# Patient Record
Sex: Male | Born: 1935 | Race: White | Hispanic: No | Marital: Married | Smoking: Never smoker
Health system: Southern US, Community
[De-identification: ages and names within clinical notes are randomized; demographics above are authoritative.]

## PROBLEM LIST (undated history)

## (undated) DIAGNOSIS — F329 Major depressive disorder, single episode, unspecified: Secondary | ICD-10-CM

## (undated) DIAGNOSIS — I509 Heart failure, unspecified: Secondary | ICD-10-CM

## (undated) DIAGNOSIS — N289 Disorder of kidney and ureter, unspecified: Secondary | ICD-10-CM

## (undated) DIAGNOSIS — M1990 Unspecified osteoarthritis, unspecified site: Secondary | ICD-10-CM

## (undated) DIAGNOSIS — I50810 Right heart failure, unspecified: Secondary | ICD-10-CM

## (undated) DIAGNOSIS — Z8489 Family history of other specified conditions: Secondary | ICD-10-CM

## (undated) DIAGNOSIS — R011 Cardiac murmur, unspecified: Secondary | ICD-10-CM

## (undated) DIAGNOSIS — F0390 Unspecified dementia without behavioral disturbance: Secondary | ICD-10-CM

## (undated) DIAGNOSIS — I219 Acute myocardial infarction, unspecified: Secondary | ICD-10-CM

## (undated) DIAGNOSIS — K219 Gastro-esophageal reflux disease without esophagitis: Secondary | ICD-10-CM

## (undated) DIAGNOSIS — E785 Hyperlipidemia, unspecified: Secondary | ICD-10-CM

## (undated) DIAGNOSIS — I82409 Acute embolism and thrombosis of unspecified deep veins of unspecified lower extremity: Secondary | ICD-10-CM

## (undated) DIAGNOSIS — G4730 Sleep apnea, unspecified: Secondary | ICD-10-CM

## (undated) DIAGNOSIS — N400 Enlarged prostate without lower urinary tract symptoms: Secondary | ICD-10-CM

## (undated) DIAGNOSIS — F32A Depression, unspecified: Secondary | ICD-10-CM

## (undated) DIAGNOSIS — I2510 Atherosclerotic heart disease of native coronary artery without angina pectoris: Secondary | ICD-10-CM

## (undated) DIAGNOSIS — Z951 Presence of aortocoronary bypass graft: Secondary | ICD-10-CM

## (undated) DIAGNOSIS — I10 Essential (primary) hypertension: Secondary | ICD-10-CM

## (undated) DIAGNOSIS — R600 Localized edema: Secondary | ICD-10-CM

## (undated) DIAGNOSIS — R0602 Shortness of breath: Secondary | ICD-10-CM

## (undated) HISTORY — DX: Hyperlipidemia, unspecified: E78.5

## (undated) HISTORY — DX: Sleep apnea, unspecified: G47.30

## (undated) HISTORY — PX: APPENDECTOMY: SHX54

## (undated) HISTORY — DX: Presence of aortocoronary bypass graft: Z95.1

## (undated) HISTORY — PX: CATARACT EXTRACTION, BILATERAL: SHX1313

## (undated) HISTORY — DX: Atherosclerotic heart disease of native coronary artery without angina pectoris: I25.10

## (undated) HISTORY — DX: Localized edema: R60.0

## (undated) HISTORY — DX: Right heart failure, unspecified: I50.810

## (undated) HISTORY — DX: Acute embolism and thrombosis of unspecified deep veins of unspecified lower extremity: I82.409

## (undated) HISTORY — PX: CARDIAC CATHETERIZATION: SHX172

## (undated) HISTORY — DX: Essential (primary) hypertension: I10

## (undated) HISTORY — PX: BACK SURGERY: SHX140

---

## 1983-08-01 DIAGNOSIS — Z951 Presence of aortocoronary bypass graft: Secondary | ICD-10-CM

## 1983-08-01 HISTORY — PX: CORONARY ARTERY BYPASS GRAFT: SHX141

## 1983-08-01 HISTORY — DX: Presence of aortocoronary bypass graft: Z95.1

## 1998-02-23 ENCOUNTER — Ambulatory Visit (HOSPITAL_COMMUNITY): Admission: RE | Admit: 1998-02-23 | Discharge: 1998-02-23 | Payer: Self-pay | Admitting: Cardiology

## 2001-06-10 ENCOUNTER — Encounter: Payer: Self-pay | Admitting: Cardiology

## 2001-06-10 ENCOUNTER — Ambulatory Visit (HOSPITAL_COMMUNITY): Admission: RE | Admit: 2001-06-10 | Discharge: 2001-06-10 | Payer: Self-pay | Admitting: Cardiology

## 2003-06-09 ENCOUNTER — Ambulatory Visit (HOSPITAL_COMMUNITY): Admission: RE | Admit: 2003-06-09 | Discharge: 2003-06-09 | Payer: Self-pay | Admitting: Gastroenterology

## 2003-07-08 ENCOUNTER — Inpatient Hospital Stay (HOSPITAL_COMMUNITY): Admission: RE | Admit: 2003-07-08 | Discharge: 2003-07-13 | Payer: Self-pay | Admitting: Orthopedic Surgery

## 2003-07-13 ENCOUNTER — Inpatient Hospital Stay
Admission: RE | Admit: 2003-07-13 | Discharge: 2003-07-27 | Payer: Self-pay | Admitting: Physical Medicine & Rehabilitation

## 2003-07-23 ENCOUNTER — Ambulatory Visit (HOSPITAL_COMMUNITY)
Admission: RE | Admit: 2003-07-23 | Discharge: 2003-07-23 | Payer: Self-pay | Admitting: Physical Medicine & Rehabilitation

## 2005-07-13 ENCOUNTER — Encounter: Payer: Self-pay | Admitting: Pulmonary Disease

## 2005-08-29 ENCOUNTER — Encounter: Payer: Self-pay | Admitting: Pulmonary Disease

## 2006-03-05 ENCOUNTER — Ambulatory Visit (HOSPITAL_COMMUNITY): Admission: RE | Admit: 2006-03-05 | Discharge: 2006-03-05 | Payer: Self-pay | Admitting: Cardiology

## 2007-12-31 ENCOUNTER — Ambulatory Visit: Payer: Self-pay | Admitting: Pulmonary Disease

## 2007-12-31 DIAGNOSIS — G4733 Obstructive sleep apnea (adult) (pediatric): Secondary | ICD-10-CM | POA: Insufficient documentation

## 2007-12-31 DIAGNOSIS — E785 Hyperlipidemia, unspecified: Secondary | ICD-10-CM | POA: Insufficient documentation

## 2007-12-31 DIAGNOSIS — I10 Essential (primary) hypertension: Secondary | ICD-10-CM | POA: Insufficient documentation

## 2008-03-29 ENCOUNTER — Ambulatory Visit: Payer: Self-pay | Admitting: *Deleted

## 2008-03-30 ENCOUNTER — Inpatient Hospital Stay (HOSPITAL_COMMUNITY): Admission: EM | Admit: 2008-03-30 | Discharge: 2008-04-02 | Payer: Self-pay | Admitting: Emergency Medicine

## 2008-03-30 ENCOUNTER — Encounter (INDEPENDENT_AMBULATORY_CARE_PROVIDER_SITE_OTHER): Payer: Self-pay | Admitting: *Deleted

## 2008-03-30 ENCOUNTER — Ambulatory Visit: Payer: Self-pay | Admitting: Surgery

## 2008-03-30 ENCOUNTER — Ambulatory Visit: Payer: Self-pay | Admitting: *Deleted

## 2008-11-04 ENCOUNTER — Encounter: Admission: RE | Admit: 2008-11-04 | Discharge: 2008-11-04 | Payer: Self-pay | Admitting: Cardiology

## 2009-03-24 ENCOUNTER — Inpatient Hospital Stay (HOSPITAL_COMMUNITY): Admission: EM | Admit: 2009-03-24 | Discharge: 2009-04-06 | Payer: Self-pay | Admitting: Emergency Medicine

## 2009-03-26 ENCOUNTER — Ambulatory Visit: Payer: Self-pay | Admitting: Physical Medicine & Rehabilitation

## 2009-03-30 ENCOUNTER — Encounter: Payer: Self-pay | Admitting: Internal Medicine

## 2009-04-06 ENCOUNTER — Inpatient Hospital Stay (HOSPITAL_COMMUNITY)
Admission: RE | Admit: 2009-04-06 | Discharge: 2009-04-17 | Payer: Self-pay | Admitting: Physical Medicine & Rehabilitation

## 2009-10-13 ENCOUNTER — Encounter: Admission: RE | Admit: 2009-10-13 | Discharge: 2009-10-13 | Payer: Self-pay | Admitting: Cardiology

## 2010-08-21 ENCOUNTER — Encounter: Payer: Self-pay | Admitting: Cardiology

## 2010-11-04 LAB — BASIC METABOLIC PANEL
BUN: 24 mg/dL — ABNORMAL HIGH (ref 6–23)
BUN: 28 mg/dL — ABNORMAL HIGH (ref 6–23)
BUN: 23 mg/dL (ref 6–23)
CO2: 22 mEq/L (ref 19–32)
CO2: 24 mEq/L (ref 19–32)
CO2: 22 mEq/L (ref 19–32)
Calcium: 8.5 mg/dL (ref 8.4–10.5)
Calcium: 8.8 mg/dL (ref 8.4–10.5)
Chloride: 102 mEq/L (ref 96–112)
Chloride: 107 mEq/L (ref 96–112)
Chloride: 107 mEq/L (ref 96–112)
Creatinine, Ser: 1.16 mg/dL (ref 0.4–1.5)
Creatinine, Ser: 1.33 mg/dL (ref 0.4–1.5)
GFR calc Af Amer: >60 mL/min (ref >60)
GFR calc Af Amer: >60 mL/min (ref >60)
GFR calc non Af Amer: 51 mL/min — ABNORMAL LOW (ref >60)
GFR calc non Af Amer: >60 mL/min (ref >60)
Glucose, Bld: 112 mg/dL — ABNORMAL HIGH (ref 70–99)
Glucose, Bld: 99 mg/dL (ref 70–99)
Glucose, Bld: 109 mg/dL — ABNORMAL HIGH (ref 70–99)
Potassium: 4.4 mEq/L (ref 3.5–5.1)
Potassium: 4.6 mEq/L (ref 3.5–5.1)
Potassium: 4.2 mEq/L (ref 3.5–5.1)
Sodium: 131 mEq/L — ABNORMAL LOW (ref 135–145)
Sodium: 135 mEq/L (ref 135–145)
Sodium: 138 mEq/L (ref 135–145)

## 2010-11-04 LAB — CBC
HCT: 37.9 % — ABNORMAL LOW (ref 39.0–52.0)
HCT: 34.4 % — ABNORMAL LOW (ref 39.0–52.0)
HCT: 35.8 % — ABNORMAL LOW (ref 39.0–52.0)
HCT: 36.8 % — ABNORMAL LOW (ref 39.0–52.0)
Hemoglobin: 13 g/dL (ref 13.0–17.0)
Hemoglobin: 12.3 g/dL — ABNORMAL LOW (ref 13.0–17.0)
Hemoglobin: 12.7 g/dL — ABNORMAL LOW (ref 13.0–17.0)
MCHC: 34.2 g/dL (ref 30.0–36.0)
MCHC: 33.7 g/dL (ref 30.0–36.0)
MCHC: 33.7 g/dL (ref 30.0–36.0)
MCHC: 34.4 g/dL (ref 30.0–36.0)
MCV: 94.7 fL (ref 78.0–100.0)
MCV: 94.2 fL (ref 78.0–100.0)
MCV: 94.4 fL (ref 78.0–100.0)
MCV: 94.8 fL (ref 78.0–100.0)
MCV: 95.3 fL (ref 78.0–100.0)
Platelets: 248 10*3/uL (ref 150–400)
Platelets: 220 10*3/uL (ref 150–400)
Platelets: 225 10*3/uL (ref 150–400)
RBC: 4 MIL/uL — ABNORMAL LOW (ref 4.22–5.81)
RBC: 3.56 MIL/uL — ABNORMAL LOW (ref 4.22–5.81)
RBC: 3.63 MIL/uL — ABNORMAL LOW (ref 4.22–5.81)
RBC: 3.89 MIL/uL — ABNORMAL LOW (ref 4.22–5.81)
RDW: 13.3 % (ref 11.5–15.5)
RDW: 13.2 % (ref 11.5–15.5)
WBC: 8.2 10*3/uL (ref 4.0–10.5)
WBC: 8.1 10*3/uL (ref 4.0–10.5)
WBC: 8.2 10*3/uL (ref 4.0–10.5)
WBC: 8.5 10*3/uL (ref 4.0–10.5)

## 2010-11-04 LAB — COMPREHENSIVE METABOLIC PANEL
ALT: 21 U/L (ref 0–53)
AST: 39 U/L — ABNORMAL HIGH (ref 0–37)
Albumin: 2.8 g/dL — ABNORMAL LOW (ref 3.5–5.2)
Alkaline Phosphatase: 84 U/L (ref 39–117)
BUN: 30 mg/dL — ABNORMAL HIGH (ref 6–23)
CO2: 23 mEq/L (ref 19–32)
Calcium: 8.9 mg/dL (ref 8.4–10.5)
Chloride: 104 mEq/L (ref 96–112)
Creatinine, Ser: 1.35 mg/dL (ref 0.4–1.5)
GFR calc Af Amer: >60 mL/min (ref >60)
GFR calc non Af Amer: 50 mL/min — ABNORMAL LOW (ref >60)
Glucose, Bld: 108 mg/dL — ABNORMAL HIGH (ref 70–99)
Potassium: 5.1 mEq/L (ref 3.5–5.1)
Sodium: 133 mEq/L — ABNORMAL LOW (ref 135–145)
Total Bilirubin: 1.7 mg/dL — ABNORMAL HIGH (ref 0.3–1.2)
Total Protein: 5.7 g/dL — ABNORMAL LOW (ref 6.0–8.3)

## 2010-11-04 LAB — PROTIME-INR
INR: 2.4 — ABNORMAL HIGH (ref 0.00–1.49)
INR: 2.1 — ABNORMAL HIGH (ref 0.00–1.49)
INR: 2.1 — ABNORMAL HIGH (ref 0.00–1.49)
INR: 2.3 — ABNORMAL HIGH (ref 0.00–1.49)
INR: 2.3 — ABNORMAL HIGH (ref 0.00–1.49)
INR: 1.3 (ref 0.00–1.49)
INR: 2.3 — ABNORMAL HIGH (ref 0.00–1.49)
Prothrombin Time: 25.9 seconds — ABNORMAL HIGH (ref 11.6–15.2)
Prothrombin Time: 23 seconds — ABNORMAL HIGH (ref 11.6–15.2)
Prothrombin Time: 23.3 seconds — ABNORMAL HIGH (ref 11.6–15.2)
Prothrombin Time: 25.4 seconds — ABNORMAL HIGH (ref 11.6–15.2)
Prothrombin Time: 25.4 seconds — ABNORMAL HIGH (ref 11.6–15.2)
Prothrombin Time: 27.9 seconds — ABNORMAL HIGH (ref 11.6–15.2)
Prothrombin Time: 14.6 seconds (ref 11.6–15.2)
Prothrombin Time: 20.6 seconds — ABNORMAL HIGH (ref 11.6–15.2)

## 2010-11-04 LAB — URINALYSIS, MICROSCOPIC ONLY
Bilirubin Urine: NEGATIVE
Glucose, UA: NEGATIVE mg/dL
Hgb urine dipstick: NEGATIVE
Ketones, ur: NEGATIVE mg/dL
Leukocytes, UA: NEGATIVE
Nitrite: NEGATIVE
Protein, ur: NEGATIVE mg/dL
Specific Gravity, Urine: 1.008 (ref 1.005–1.030)
Urobilinogen, UA: 0.2 mg/dL (ref 0.0–1.0)
pH: 5 (ref 5.0–8.0)

## 2010-11-04 LAB — DIFFERENTIAL
Basophils Absolute: 0 10*3/uL (ref 0.0–0.1)
Basophils Relative: 1 % (ref 0–1)
Eosinophils Absolute: 0.2 10*3/uL (ref 0.0–0.7)
Eosinophils Relative: 3 % (ref 0–5)
Lymphocytes Relative: 31 % (ref 12–46)
Lymphs Abs: 2.5 10*3/uL (ref 0.7–4.0)
Monocytes Absolute: 0.7 10*3/uL (ref 0.1–1.0)
Monocytes Relative: 9 % (ref 3–12)
Neutro Abs: 4.6 10*3/uL (ref 1.7–7.7)
Neutrophils Relative %: 57 % (ref 43–77)

## 2010-11-04 LAB — URINE CULTURE: Colony Count: 60000

## 2010-11-04 LAB — HEPARIN LEVEL (UNFRACTIONATED): Heparin Unfractionated: 0.22 IU/mL — ABNORMAL LOW (ref 0.30–0.70)

## 2010-11-05 LAB — DIFFERENTIAL
Lymphocytes Relative: 14 % (ref 12–46)
Lymphs Abs: 1.9 10*3/uL (ref 0.7–4.0)
Neutrophils Relative %: 76 % (ref 43–77)

## 2010-11-05 LAB — PROTIME-INR
INR: 2.1 — ABNORMAL HIGH (ref 0.00–1.49)
INR: 2.5 — ABNORMAL HIGH (ref 0.00–1.49)
INR: 3 — ABNORMAL HIGH (ref 0.00–1.49)
Prothrombin Time: 18.4 seconds — ABNORMAL HIGH (ref 11.6–15.2)
Prothrombin Time: 23.1 seconds — ABNORMAL HIGH (ref 11.6–15.2)
Prothrombin Time: 27.1 seconds — ABNORMAL HIGH (ref 11.6–15.2)
Prothrombin Time: 27.8 seconds — ABNORMAL HIGH (ref 11.6–15.2)
Prothrombin Time: 30.2 seconds — ABNORMAL HIGH (ref 11.6–15.2)

## 2010-11-05 LAB — BASIC METABOLIC PANEL
BUN: 20 mg/dL (ref 6–23)
BUN: 37 mg/dL — ABNORMAL HIGH (ref 6–23)
BUN: 39 mg/dL — ABNORMAL HIGH (ref 6–23)
CO2: 24 mEq/L (ref 19–32)
CO2: 26 mEq/L (ref 19–32)
Calcium: 8.6 mg/dL (ref 8.4–10.5)
Chloride: 107 mEq/L (ref 96–112)
Chloride: 108 mEq/L (ref 96–112)
Chloride: 109 mEq/L (ref 96–112)
Creatinine, Ser: 1.25 mg/dL (ref 0.4–1.5)
Creatinine, Ser: 1.58 mg/dL — ABNORMAL HIGH (ref 0.4–1.5)
Creatinine, Ser: 1.69 mg/dL — ABNORMAL HIGH (ref 0.4–1.5)
GFR calc Af Amer: 47 mL/min — ABNORMAL LOW (ref >60)
GFR calc Af Amer: >60 mL/min (ref >60)
GFR calc Af Amer: >60 mL/min (ref >60)
GFR calc non Af Amer: 39 mL/min — ABNORMAL LOW (ref >60)
GFR calc non Af Amer: >60 mL/min (ref >60)
GFR calc non Af Amer: >60 mL/min (ref >60)
Glucose, Bld: 111 mg/dL — ABNORMAL HIGH (ref 70–99)
Glucose, Bld: 124 mg/dL — ABNORMAL HIGH (ref 70–99)
Potassium: 4.3 mEq/L (ref 3.5–5.1)
Potassium: 4.4 mEq/L (ref 3.5–5.1)
Potassium: 4.7 mEq/L (ref 3.5–5.1)
Potassium: 4.7 mEq/L (ref 3.5–5.1)
Potassium: 4.8 mEq/L (ref 3.5–5.1)
Sodium: 136 mEq/L (ref 135–145)
Sodium: 138 mEq/L (ref 135–145)

## 2010-11-05 LAB — CBC
HCT: 34.5 % — ABNORMAL LOW (ref 39.0–52.0)
HCT: 35 % — ABNORMAL LOW (ref 39.0–52.0)
HCT: 36.3 % — ABNORMAL LOW (ref 39.0–52.0)
Hemoglobin: 12.1 g/dL — ABNORMAL LOW (ref 13.0–17.0)
MCHC: 33.7 g/dL (ref 30.0–36.0)
MCHC: 34 g/dL (ref 30.0–36.0)
MCV: 93.8 fL (ref 78.0–100.0)
MCV: 95 fL (ref 78.0–100.0)
Platelets: 239 10*3/uL (ref 150–400)
Platelets: 249 10*3/uL (ref 150–400)
Platelets: 303 10*3/uL (ref 150–400)
RBC: 3.74 MIL/uL — ABNORMAL LOW (ref 4.22–5.81)
RBC: 3.83 MIL/uL — ABNORMAL LOW (ref 4.22–5.81)
WBC: 12 10*3/uL — ABNORMAL HIGH (ref 4.0–10.5)
WBC: 13.5 10*3/uL — ABNORMAL HIGH (ref 4.0–10.5)
WBC: 7.7 10*3/uL (ref 4.0–10.5)

## 2010-11-05 LAB — TSH: TSH: 1.48 u[IU]/mL (ref 0.350–4.500)

## 2010-11-05 LAB — URINALYSIS, ROUTINE W REFLEX MICROSCOPIC
Ketones, ur: NEGATIVE mg/dL
Nitrite: NEGATIVE
Protein, ur: NEGATIVE mg/dL
pH: 5 (ref 5.0–8.0)

## 2010-11-05 LAB — CARDIAC PANEL(CRET KIN+CKTOT+MB+TROPI)
CK, MB: 4 ng/mL (ref 0.3–4.0)
Relative Index: INVALID (ref 0.0–2.5)
Total CK: 215 U/L (ref 7–232)
Troponin I: 0.02 ng/mL (ref 0.00–0.06)
Troponin I: 0.03 ng/mL (ref 0.00–0.06)

## 2010-11-05 LAB — HEPARIN LEVEL (UNFRACTIONATED): Heparin Unfractionated: 0.73 IU/mL — ABNORMAL HIGH (ref 0.30–0.70)

## 2010-11-05 LAB — HEPATIC FUNCTION PANEL
ALT: 21 U/L (ref 0–53)
Alkaline Phosphatase: 95 U/L (ref 39–117)
Total Bilirubin: 0.9 mg/dL (ref 0.3–1.2)

## 2010-11-05 LAB — POCT CARDIAC MARKERS
CKMB, poc: 2.4 ng/mL (ref 1.0–8.0)
Myoglobin, poc: 225 ng/mL (ref 12–200)
Troponin i, poc: <0.05 ng/mL (ref 0.00–0.09)

## 2010-11-05 LAB — APTT: aPTT: 98 seconds — ABNORMAL HIGH (ref 24–37)

## 2010-12-13 NOTE — Discharge Summary (Signed)
NAME:  Dale Whitehead, Dale Whitehead NO.:  [id number]   MEDICAL RECORD NO.:  [id number]          PATIENT TYPE:  INP   LOCATION:  5154                         FACILITY:  [facility]   PHYSICIAN:  Kela Millin, M.D.DATE OF BIRTH:  1924-02-07   DATE OF ADMISSION:  03/30/2008  DATE OF DISCHARGE:  04/02/2008                               DISCHARGE SUMMARY   DISCHARGE DIAGNOSES:  1. Bilateral deep vein thrombosis.  2. Hypertension.  3. History of coronary artery disease and status post coronary artery      bypass graft.  4. History of obstructive sleep apnea on continuous positive airway      pressure.  5. History of hyperlipidemia.  6. History of benign prostatic hypertrophy.  7. History of chronic low back pain.   PROCEDURES/STUDIES:  1. Lower extremity Doppler ultrasound - DVT present in the common      femoral and profunda veins bilaterally with superficial thrombosis      in the proximal left greater saphenous vein.  2. CT angiogram of chest - no evidence of pulmonary embolism, no acute      pulmonary parenchymal abnormality.   BRIEF HISTORY:  The patient is an 77 year old white male with the above  listed medical problems as well as history of a DVT about 8 years ago,  who presented with complaints of left leg swelling that had been  persistent and progressed all the way up to his thigh and even in the  perineal area.  He reported that he has had a DVT in the right lower  extremity about 8 years ago.  The patient denied chest pain,  palpitations, dyspnea and no pleuritic pain.   Please see the full admission history and physical dictated by Dr. Flonnie Overman  for the details of the admission physical exam as well as the laboratory  data.   HOSPITAL COURSE:  1. Bilateral deep vein thrombosis - second episode, as discussed      above.  Upon admission, the patient was started on anticoagulation      with heparin, Lovenox and Coumadin.  His PT/INR was monitored and      today it  is day 4 and his INR is 2.3.  He has not had any evidence      of bleeding.  His left lower extremity, the swelling is markedly      improved and he is hemodynamically stable.  He will be discharged      on Lovenox for 1 more day.  If his INR is still therapeutic on      recheck in the morning, the Lovenox will need to be discontinued at      that time.  The home health RN is to check his PT/INR in a.m. and      call it to his primary care physician, Dr. Catha Gosselin for further      management and dosing of the Coumadin.  As already noted, this was      the patient's second episode of DVT.  He will need to be on      Coumadin long-term  and follow up with him for further      recommendations.  His aspirin was discontinued in the hospital.  2. History of hypertension - the patient was maintained on his      preadmission medications during his hospital stay and is to      continue this upon discharge.  3. History of obstructive sleep apnea  - he is to continue CPAP upon      discharge.  4. History of coronary artery disease - the patient remained chest      pain free during his hospital stay.  He is to follow up with      cardiology upon discharge.  He was maintained on his Zetia in the      hospital.  5. History of chronic low back pain - the patient was maintained on      his Lidoderm patch in the hospital.  6. History of BPH - he is to continue Flomax upon discharge.   DISCHARGE MEDICATIONS:  1. Lovenox 75 mg subcutaneous q.12 h., to be discontinued if INR      remains therapeutic upon recheck in the morning.  2. Coumadin 5 mg 1 tablet p.o. q.p.m. or as directed per PCP.  3. The patient to discontinue aspirin.  4. The patient to continue Pepcid 20 mg b.i.d.  5. Tramadol 50 mg p.o. daily.  6. Zetia 10 mg p.o. daily.  7. Torsemide 20 mg p.o. daily.  8. Verapamil 120 mg p.o. daily.  9. Flomax 0.4 mg p.o. daily.  10.K-Dur 20 mg p.o. daily.  11.Lidoderm patch on 12 hours and off 12  hours as previously.   FOLLOW-UP CARE:  1. Dr. Catha Gosselin next week.  Patient to call for appointment.  2. Home health RN for PT/INR in a.m.  3. Cardiology, patient to call for appointment upon discharge.   CONDITION ON DISCHARGE:  Improved, stable.      Kela Millin, M.D.  Electronically Signed     ACV/MEDQ  D:  04/02/2008  T:  04/02/2008  Job:  [id number]   cc:   Caryn Bee L. Little, M.D.

## 2010-12-13 NOTE — H&P (Signed)
NAME:  Dale Whitehead, Dale Whitehead NO.:  [id number]   MEDICAL RECORD NO.:  [id number]          PATIENT TYPE:  INP   LOCATION:                               FACILITY:  [facility]   PHYSICIAN:  Lucita Ferrara, MD         DATE OF BIRTH:  07/30/1924   DATE OF ADMISSION:  03/30/2008  DATE OF DISCHARGE:                              HISTORY & PHYSICAL   The patient is an 77 year old Caucasian male with a past medical history  significant for coronary artery disease status post CABG, hypertension,  hypercholesteremia, obstructive sleep apnea, and a history of deep vein  thrombosis eight years ago, who presents with acute swelling of his left  lower extremity that is persistent and the swelling progresses all the  way up his entire lower extremity, his thigh, and even in the perineal  area.  He had a similar episode eight years ago that was located in his  right lower extremity and his symptoms are much similar to that episode.  He denies any chest pain, palpitations, dyspnea, or pleuritis.   PAST MEDICAL HISTORY:  1. History of coronary artery disease.  2. Hypertension.  3. Hyperlipidemia.  4. Obstructive sleep apnea, on CPAP.  5. DVT.   PAST SURGICAL HISTORY:  Status post CABG in 1999.   SOCIAL HISTORY:  He denies drugs, alcohol, or tobacco.   MEDICATIONS AT HOME:  1. Verapamil 120 mg daily.  2. Flomax 0.4 mg daily.  3. Pepcid 10 mg twice a day.  4. Ecotrin 81 mg daily.  5. Tramadol 50 mg p.o. daily.  6. Zetia 10 mg daily.  7. Furosemide 20 mg p.o. daily.  8. Potassium chloride 20 mEq p.o. daily.   REVIEW OF SYSTEMS:  As per HPI, otherwise negative.   EKG shows sinus tachycardia with chronic right lower branch block,  unchanged from July 16, 2003.  CT scan of the chest showed no  pulmonary embolism.   PHYSICAL EXAMINATION:  GENERAL:  The patient is in no acute distress.  VITAL SIGNS:  Blood pressure is 152/84, pulse 105, respirations 18,  temperature is 98.2.  The  patient's pulse ox is 95% on room air.  HEENT:  Normocephalic, atraumatic.  Sclerae are anicteric.  Neck is  supple.  No JVD or carotid bruits.  PERRLA.  Extraocular muscles intact.  CARDIOVASCULAR:  S1 and S2, regular rate and rhythm.  No murmurs, rubs,  or clicks.  LUNGS:  Clear to auscultation bilaterally.  No rhonchi, rales, or  wheezes.  NEURO:  The patient is alert and oriented times three.  Cranial nerves  II through XII are grossly intact.   LABORATORY DATA:  Urinalysis essentially negative.  D-dimer is greater  than 20.  Complete metabolic panel shows sodium 132, albumin of 3.2, BUN  of 29, creatinine 1.17.  Beta natriuretic peptide 46.  INR is 1.1.  Cardiac enzymes negative.  The patient had a CT angio, which was not  negative, with no evidence of pulmonary embolism.  Chest x-ray:  Mild  cardiac enlargement, without any acute pulmonary process.   ASSESSMENT  AND PLAN:  An 76 year old with:  1. Acute left lower extremity swelling, very impressive and      significant, likely deep vein thrombosis with D-dimer greater than      20.  CT angiogram, however, is negative for pulmonary embolism.  2. Coronary artery disease, status post coronary artery bypass graft.      Continue aspirin.  Continue Zetia.  3. Obstructive sleep apnea on continuous positive airway pressure at      home.  Continue CPAP with same settings.  4. Hypertension, controlled with verapamil.  5. Hyperlipidemia, on Zetia.  6. Benign prostatic hypertrophy, on Flomax.   PLAN:  Will initiate heparin protocol, therapeutic dose.  Will get  bilateral lower extremity Dopplers, rule out DVT in the right lower  extremity.  DVT and GI prophylaxis.  The rest of the plans are dependent  upon his progress.      Lucita Ferrara, MD  Electronically Signed     RR/MEDQ  D:  03/30/2008  T:  03/30/2008  Job:  [id number]

## 2010-12-13 NOTE — H&P (Signed)
NAME:  Dale Whitehead, Dale Whitehead NO.:  [id number]   MEDICAL RECORD NO.:  [id number]          PATIENT TYPE:  INP   LOCATION:  0102                         FACILITY:  [facility]   PHYSICIAN:  Peggye Pitt, M.D. DATE OF BIRTH:  June 06, 1924   DATE OF ADMISSION:  03/24/2009  DATE OF DISCHARGE:                              HISTORY & PHYSICAL   CHIEF COMPLAINT:  Fall and weakness.   HISTORY OF PRESENT ILLNESS:  Dale Whitehead is an 77 year old male with a  history of CAD, HTN, hyperlipidemia, and chronic low back pain.  He  states that this morning after voiding he walked out of the bathroom and  his legs gave out.  He also states that his legs felt like rubber and  more significantly, once he was on the ground, he was unable to get off  of the floor even with the assistance of his family.  It was at this  point that they called EMS for transport to the emergency department.  He denies loss of conscientiousness, dizziness, he does acknowledge some  mild nausea, no vomiting.  Of note, he had an epidural steroid injection  on August 13 and is under the care of Dr. Modesta Messing for chronic low back  pain after a lifting injury that occurred in January of this year.  We  are asked to admit for further evaluation and treatment.   ALLERGIES:  AMBIEN.   PAST MEDICAL HISTORY:  1. Coronary artery disease.  2. Hypertension.  3. Hyperlipidemia.  4. Sleep apnea.  5. Deep vein thrombosis.  6. Benign prostatic hyperplasia.  7. Chronic low back pain.   PAST SURGICAL HISTORY:  1. Coronary artery bypass graft in 1999.  2. Right cataract surgery, 2009.  3. A series of epidural steroid injections to lumbar spine.  4. Left shoulder replacement, 2005.  5. Appendectomy approximately 40 years ago.   SOCIAL HISTORY:  Negative for tobacco, alcohol, or illicit drugs.   FAMILY HISTORY:  Noncontributory for this admission.   MEDICATIONS:  1. Lisinopril 20 mg 1/2 tablet daily.  2. Coumadin 3 mg 4 nights a  week.  3. Coumadin 4 mg 3 nights a week.  4. MiraLax 17 gm q.h.s.  5. Colace 1 tablet q.h.s.  6. Potassium chloride 20 mEq p.o. daily.  7. Tamsulosin 0.4 mg p.o. daily.  8. Torsemide 20 mg p.o. daily.   REVIEW OF SYSTEMS:  GENERAL:  Negative for fever, negative for chills.  Positive for decreased appetite, positive for generalized weakness  increasing over the last several months.  EAR, NOSE, AND THROAT:  Negative for sore throat, negative for nasal congestion.  CARDIOVASCULAR:  Negative for chest pain.  Negative for palpitations,  negative for lower extremity edema.  RESPIRATIONS:  Negative for  shortness of breath, negative cough.  MUSCULOSKELETAL:  Positive for low  back pain, positive for the left shoulder achiness on occasion;  otherwise, no joint pain.  Positive for muscle weakness.  NEUROLOGICAL:  Negative headache, negative visual disturbances.  Positive for decreased  peripheral vision of the left eye.  GASTROINTESTINAL:  Positive  constipation.  Negative diarrhea,  negative abdominal pain.  GENITOURINARY:  Negative for dysuria, negative hematuria, negative  frequency.  PSYCHIATRIC:  Positive for anxiety, positive for depression.  SKIN:  Negative rashes, negative for lesions.   LABORATORY DATA/RADIOLOGY:  His sodium is 131, potassium 4.8, chloride  101, CO2 22, BUN 39, creatinine 1.69.  White count 13.5, hemoglobin  14.1, hematocrit 41.0, platelets 303, glucose 123.  Urinalysis was  negative.  Myoglobin 225, CK-MB 2.4, troponin less than 0.05.  CT of the  chest showed post CABG, no CHF.  Lungs clear.  L-spine x-ray:  L4-5  compression fracture of indeterminate age.  Osteopenia.   PHYSICAL EXAMINATION:  Temperature 98.5, blood pressure 135/79, heart  rate 100, respirations 18, 95% on room air.  GENERAL:  Pleasant, alert, oriented, sitting up in bed in no acute  distress.  HEAD:  Normocephalic, atraumatic.  CARDIOVASCULAR:  Regular rate and rhythm, no gallop, no murmur.   Trace  lower extremity edema.  RESPIRATIONS:  Clear to auscultation bilaterally.  No wheezes, no rales,  no increased work of breathing.  MUSCULOSKELETAL:  Joints without swelling or redness, movement of  extremities.  NEUROLOGICAL:  Alert and oriented.  Speech clear, facial symmetry,  sensation, and extremities intact bilaterally.  Bilateral grip 3/5  bilaterally.  GENITOURINARY:  Abdomen round, slightly firm, nontender with decreased  bowel sounds.  PSYCHIATRIC:  He is calm, appropriate, cooperative.  SKIN:  Negative for lesions or rashes.   ASSESSMENT AND PLAN:  1. Fall, weakness, probably secondary to dehydration but need to      consider his chronic low back pain and the  L4-5 compression fracture of indeterminate age.  We will get a PT/OT  consult, consider some Home Health versus placement needs.  1. Acute renal insufficiency, probably secondary to dehydration.  We      will admit to telemetry given history of coronary artery disease.      We will hydrate with IV fluids.  We will hold ACE and diuretic,      check orthostatics, get a BMET in the morning.  If no improvement,      we will consider ultrasound to rule out renal obstruction.  We will      consider discontinuing telemetry after first 24 hours, if no      arrhythmias.  2. Hyperlipidemia.  He was on statin probably 5 months ago, which they      took him off due to previous side effects that were quite severe in      terms of edema with muscle pain and weakness.  We will check liver      function tests.  3. Hypertension.  Blood pressure well-controlled.  We will monitor and      introduce medications as needed.  4. History of deep venous thrombosis.  We will check a PT/INR, ask      pharmacy to monitor and dose the Coumadin.  If INR is      subtherapeutic, we may need to bridge with full dose Lovenox.  5. History of coronary artery disease.  Stable with no chest pain, no      shortness of breath.  Nevertheless, we  will cycle cardiac enzymes      and place on telemetry.  Again, we will consider discontinuing the      telemetry if no arrhythmias after the first 24 hours.  6. Sleep apnea.  He is on nightly CPAP.  We will ask respiratory      therapy to titrate  CPAP.  7. Leukocytosis.  His white count is 13.5.  He is currently afebrile.      Chest x-ray shows lungs are clear.  Urinalysis is negative.  No      need for antibiotics at this time.  We will continue to monitor.     ______________________________  Toya Smothers, NP      Peggye Pitt, M.D.  Electronically Signed    KB/MEDQ  D:  03/24/2009  T:  03/24/2009  Job:  [id number]

## 2010-12-13 NOTE — Group Therapy Note (Signed)
NAME:  Dale Whitehead, Dale Whitehead NO.:  [id number]   MEDICAL RECORD NO.:  [id number]          PATIENT TYPE:  INP   LOCATION:  1414                         [facility]ACILITY:  [facility]   PHYSICIAN:  Hollice Espy, M.D.DATE OF BIRTH:  1923/08/31                                 PROGRESS NOTE   This summary will cover events from August 25 to March 30, 2009.   PRIMARY CARE PHYSICIAN:  Caryn Bee L. Little, M.D.   CONSULTANTS ON THE CASE:  Hospice and palliative care medicine and Dr. Ruel Favors in  interventional radiology and inpatient rehab.   HOSPITAL COURSE:  The patient is an 77 year old white male with past medical history of  CAD, hypertension and DVT on chronic Coumadin who presented to the  emergency room with difficulty walking, his legs are very weak and also  some significant lower back pain.  The patient had an epidural steroid  injection.  Has had a history of chronic low back pain but he felt that  his pain has been even worse.  He was brought in to the emergency room  for further evaluation and treatment.  Initially on presentation he was  found to have a white count of 13.5, an elevated BUN and creatinine with  a BUN of 39 and creatinine of 1.6.  Labs were sent.  He showed no signs  of any urinary tract infection or pneumonia and following IV hydration,  he significantly improved.  His back pain, however, did not.  An MRI was  ordered and the patient was found to have signs of previous compression  fracture which was known but he also was found to have a new acute L4  compression fracture.  In the mean time, in discussion with the family,  they were concerned about the possibility of caring for the patient at  home, although the patient himself wanted pain control as well as the  ability to go home.  He did not look for any type of long-term  placement.  Palliative care was consulted both for pain management as  well as for goals of care.  In addition, in the meantime,  the patient  was evaluated by interventional radiology who found him appropriate for  a kyphoplasty.  The patient's Coumadin was held.  He was transitioned  over to IV heparin once his Coumadin level fell below 2.  It was down  below 1.5 on March 30, 2009 and the patient was taken over to [facility] Interventional Radiology and kyphoplasty was performed at the L4  level which no complications were noted.  The patient postoperatively  was feeling better.  He has been seen immediately postoperative and is  still not by guidelines able to move much but he says he does feel as if  his back pain is somewhat better.  In the meantime, he has been  evaluated prior to his kyphoplasty by inpatient rehab who find him  appropriate for inpatient rehab and they state that they will follow up  with him post kyphoplasty to see if he is still able to tolerate rehab.  Palliative  care plans to follow up with the patient on March 31, 2009  after his kyphoplasty for goals of care and, again, any pain control  which is needed.   His other medical issues have been relatively stable.  Following IV  fluids, his renal failure has come down to its baseline.  He had some  blood pressure issues, mostly related somewhat to pain.  His fluids have  been titrated down and his blood pressure medications have been  restarted.  He otherwise looks to be doing well.      Hollice Espy, M.D.  Electronically Signed     SKK/MEDQ  D:  03/30/2009  T:  03/30/2009  Job:  [id number]

## 2010-12-16 NOTE — Discharge Summary (Signed)
NAME:  Dale Whitehead, Dale Whitehead                      ACCOUNT NO.:  [id number]   MEDICAL RECORD NO.:  [id number]                   PATIENT TYPE:  INP   LOCATION:  5020                                 FACILITY:  [facility]   PHYSICIAN:  Harvie Junior, M.D.                DATE OF BIRTH:  1924/01/07   DATE OF ADMISSION:  07/08/2003  DATE OF DISCHARGE:  07/13/2003                                 DISCHARGE SUMMARY   ADMISSION DIAGNOSES:  1. Comminuted four part head splitting left proximal humerus fracture.  2. Coronary artery disease.  3. Hyperlipidemia.  4. Hypertension.   DISCHARGE DIAGNOSES:  1. Comminuted four part head splitting left proximal humerus fracture.  2. Coronary artery disease.  3. Hyperlipidemia.  4. Hypertension.  5. Left knee contusion.   PROCEDURE IN HOSPITAL:  Hemiarthroplasty of the left shoulder, Harvie Junior, M.D., July 08, 2003.   CONSULTATION IN HOSPITAL:  Cardiology, Gaspar Garbe B. Little, M.D.   BRIEF HISTORY AND PHYSICAL:  Dale Whitehead is a pleasant, 77 year old white male  who had a history of a fall at work on July 02, 2003 when he slipped on  some oil on the floor landing on his left shoulder area.  He went to the  emergency room where x-rays showed a comminuted head splitting left proximal  humerus fracture.  This was confirmed by CT scan.  He was seen in the office  and felt to be a candidate for a left shoulder hemiarthroplasty because of  the significant fracture in his left upper extremity.  He was admitted for  this.   PERTINENT LABORATORY AND X-RAY DATA:  EKG on admission showed normal sinus  rhythm with occasional premature supraventricular complexes with a right  bundle branch block.  X-ray of the left knee showed no definite acute bony  abnormality.  Portable x-ray of the left shoulder postoperatively showed  status post left shoulder replacement.  Hemoglobin on admission was 13.2,  hematocrit 39.5.  Indices were within normal limits.  On  postoperative day  one his hemoglobin was 11.3 with a hematocrit of 33.4.  CMET on admission  was within normal limits, other than a slightly elevated glucose at 107 and  decreased albumin at 3.2.  Urinalysis showed 3:6 WBC's per high powered  field, not felt to be clinically significant.   HOSPITAL COURSE:  The patient underwent surgery as was described in Dr.  Luiz Blare' operative note on July 08, 2003.  Postoperatively he was put on  Ancef 1 gram q. 8 hours times four doses.  He was given IV morphine for pain  control and his arm was in a sling.  Physical therapy and occupational  therapy were ordered for activities of daily living and forward flexion of  the left shoulder with his hand to the top of his head.  Cardiology consult  was obtained from Dr. Clarene Duke and he was status post coronary artery bypass  grafting.  He had some increased blood pressure postoperatively, but  currently was felt to be pain related.  He was felt to be medically stable  at this point.  He had no significant chest pain or shortness of breath  postoperatively and the examination was benign.  Cardiology signed off at  this time.  On postoperative day two, the patient was feeling better, but  not able to be up independently.  Physical therapy felt that he was a  candidate for inpatient rehabilitation for subacute bed.  Postoperative x-  rays of the left shoulder showed good position of the prosthesis.  He did  have some hiccups and had slow progress with physical therapy.  On  postoperative day five he had moderate shoulder pain, he was taking fluids  without difficulty and he was voiding without difficulty.  His vital signs  were stable and he was afebrile.  The left shoulder wound was benign.  The  patient continued to be somewhat groggy through the hospital and was slow to  move in the bed and in need of significant assistance.  He did have some  moderate abdominal distension.  The patient was then discharged  and  transferred to the subacute floor.   CONDITION ON DISCHARGE:  Improved.   ACTIVITY STATUS:  Wear a sling on the left side.  He can work on passive  forward flexion to 90 degrees with the hand to the top of the head with  physical therapy and occupational therapy.   MEDICATIONS AT DISCHARGE:  1. Percocet p.r.n. for pain.  2. His usual home medications.   We will see him while in the subacute unit every few days from an  orthopaedic viewpoint.  Certainly we will be available should any problems  crop up.      Marshia Ly, P.A.                       Harvie Junior, M.D.    Cordelia Pen  D:  09/02/2003  T:  09/03/2003  Job:  [id number]   cc:   Thereasa Solo. Little, M.D.  [address]  [address]  [place]  [place] [address]  Fax: [phone]   Al Decant. Janey Greaser, MD  [address]  [place]  [place] [address]  Fax: [phone]

## 2010-12-16 NOTE — Op Note (Signed)
NAME:  AUDLEY, HINOJOS                      ACCOUNT NO.:  [id number]   MEDICAL RECORD NO.:  [id number]                   PATIENT TYPE:  AMB   LOCATION:  ENDO                                 FACILITY:  [facility]   PHYSICIAN:  James L. Malon Kindle., M.D.          DATE OF BIRTH:  11/22/1923   DATE OF PROCEDURE:  06/09/2003  DATE OF DISCHARGE:                                 OPERATIVE REPORT   PROCEDURE:  Colonoscopy.   MEDICATIONS:  Ampicillin 2 g IV per Dr. Fredirick Maudlin office due to the patient's  previous history of bypass surgery (apparently this is felt to be needed),  fentanyl 25 mcg IV, Versed 5 mg IV.   SCOPE:  Olympus adjustable colonoscope.   INDICATIONS:  Colon cancer screening.   DESCRIPTION OF PROCEDURE:  The procedure had been explained to the patient  and consent obtained.  With the patient in the left lateral decubitus  position, the Olympus adjustable colonoscope was inserted and advanced.  The  prep was extremely poor.  The patient had a very long, tortuous colon with  dilation, with no frank volvulus.  We were able to pass through the sigmoid.  A lot of irrigation and suction was required. There was sticky, adherent  stool throughout.  Small polyps could have been missed.  The diameter of the  colon was quite dilated, really from the sigmoid on up, although there was  no gross obstruction.  Using position changes and abdominal pressure, we  eventually were able to advance down into the cecum.  There was a transient  period of time when the scope was plugged up with pieces of stool that we  were unable to suction.  Due to the inability to suction, he had pulse drop  in the 40's but after the scope was cleaned and the air was suctioned, his  pulse promptly came back up into the 60's where it remained from the 60's to  80's throughout the rest of the procedure.  The cecum was particularly  dirty.  A large of time was spent irrigating and washing.  We were able to  see  what we felt was the ileocecal valve.  The light transilluminated in  that area.  The scope was withdrawn and the colon examined.  There were no  gross lesions seen.  Again small polyps could have been missed due to the  sticky, adherent stool.  The colon was decompressed upon withdrawal.  The  ascending, transverse, descending were all dilated.  The sigmoid was much  less dilated.  No polyps were seen throughout.  There was no clear  obstruction.  The rectum was free of polyps.  No polyps were seen throughout  although a small polyp could have been missed.    ASSESSMENT:  1. No evidence of polyps in a screening colonoscopy.  2. Long, tortuous, dilated colon, probably functional.   RECOMMENDATIONS:  Will recommend Metamucil daily, and a routine  rectal with  yearly hemoccults.                                               James L. Malon Kindle., M.D.    Waldron Session  D:  06/09/2003  T:  06/09/2003  Job:  [id number]   cc:   Al Decant. Janey Greaser, MD  [address]  [place]  [place] [address]  Fax: [phone]   Thereasa Solo. Little, M.D.  [address]  [place] [address]  Fax: [phone]

## 2010-12-16 NOTE — Discharge Summary (Signed)
NAME:  Dale Whitehead, Dale Whitehead NO.:  [id number]   MEDICAL RECORD NO.:  [id number]                   PATIENT TYPE:  ORB   LOCATION:                                       FACILITY:  [facility]   PHYSICIAN:  Ellwood Dense, M.D.                DATE OF BIRTH:  [birth year]   DATE OF ADMISSION:  07/13/2003  DATE OF DISCHARGE:  07/27/2003                                 DISCHARGE SUMMARY   DISCHARGE DIAGNOSES:  1. Left comminuted proximal humerus fracture status post left shoulder     hemiarthroplasty.  2. Right femoral deep vein thrombosis.  3. History of hypertension.  4. Elevated cholesterol.  5. History of depression.  6. Anemia.  7. Cardiovascular disease.   HISTORY OF PRESENT ILLNESS:  The patient is a 77 year old white male  admitted July 08, 2003, after fall at work.  X-rays revealed proximal  left shoulder fracture, proximal humerus.  The patient underwent left should  hemiarthroplasty on July 08, 2003, by Dr. Jodi Geralds.  PT report at this  time indicated patient is ambulating approximately 80 feet minimum  assistance level. Patient also at bed mobility minimum assistance and  transfers minimum assistance.  Hospital course significant for left lower  extremity edema status post tap, hiccups, and constipation, elevated blood  pressure.  The patient was consulted, and cardiology recommended aspirin.  Presently on no DVT prophylaxis.  The patient was transferred to [facility]  Subacute Department on July 13, 2003.   PAST MEDICAL HISTORY:  1. Cardiovascular disease.  2. Hypertension.  3. Dyslipidemia.  4. Hiatal hernia.  5. GERD.  6. Left leg fracture.   PAST SURGICAL HISTORY:  1. Hernia repair.  2. CABG.  3. Appendectomy.  4. Cataracts.   MEDICATIONS PRIOR TO ADMISSION:  1. Calan 240 mg daily.  2. Wellbutrin 75 mg b.i.d.  3. Lipitor 10 mg daily.  4. Multivitamins daily.   PHYSICIANS:  Primary care Stacia Feazell, Al Decant. Janey Greaser, MD  Cardiologist, Thereasa Solo. Little, M.D.   SOCIAL HISTORY:  The patient is married and works part time at [employer].  Six children.  Denies alcohol, tobacco use.  Lives with Dale Whitehead and  lives with daughter.  The patient has good family support.   ALLERGIES:  No known drug allergies.   REVIEW OF SYSTEMS:  Significant falls, ulcer known, but patient denies it.  Constipation and joint pain.   HOSPITAL COURSE:  Dale Whitehead was admitted to [facility] on July 13, 2003, where he received more than an hour of  therapy daily.  Overall, Dale Whitehead progressed very well.  His left shoulder  healed without multiple complications.  The patient remained in sling at all  times.  The patient also receive TENS therapy which was available through  his niece who is physical therapist.  The patient's pain management Ultram.  Unfortunately, his hospital stay was  prolonged due to a right lower  extremity DVT which was identified by venogram.  The patient was placed on  Coumadin for further prophylaxis.  The patient had no bleeding complications  at that time on Coumadin.  He was placed on treatment dose Lovenox until  Coumadin reached therapeutic level.   Rest of hospital course significant for hiccups.  The patient did received  Thorazine as needed on rehab day #2.  The patient was followed periodically  by Dr. Luiz Blare as well as P.A., Gus Puma.  The patient remained on  Trinsicon 1 tablet twice daily for anemia.  July 05, 2003, pre-  transfusion hemoglobin was 8.4, and post-transfusion hemoglobin was 11.6 and  hematocrit 34.1.  The patient did have elevated blood pressure while in  rehab.  We had to increase is Verelan from 240 to 300 mg daily.   No other major issues occurred while patient was on rehab.  Incisions healed  very well.  The patient had active range of motion in his left shoulder.  He  was discharge overall supervision and minimum assistance level.   Staples  removed prior to discharge, and Steri-Strips were applied.   At time of discharge, PT report indicates patient able to perform most ADLs  supervision to minimum assistance levels, able to ambulate greater than 100  feet in home-like environment. Able to walk 120 feet with hemi-walker,  minimum assistance. Transfer sit to stand with supervision with hemi-walker  minimum assistance.  The patient made progress towards goals with therapies.  The patient's family presented for all treatment sessions and returned and  demonstrated appropriate level of assistance of patient with such as gait  belt needed for increased safety with ambulation.  The patient's daughter  also received education.  AT time of discharge, all vital signs were fair  but stable.  The patient was discharged home with family.   DISCHARGE MEDICATIONS:  1. Wellbutrin 75 mg daily.  2. Verapamil 300 mg daily.  3. Trinsicon 1 tablet twice daily.  4. Resume home Lipitor.  5. Coumadin 4 mg 2 tablets p.m.  6. Ultram 1 to 2 tablets q.6-8h. as needed for pain.  Pain management will     consisted of Tylenol and Ultram.   DISCHARGE INSTRUCTIONS:  1. No driving, no drinking, no smoking.  2. Wear a sling at all times.  3. [facility] for PT, OT, and RN.  RN to check PT/INR on     Wednesday, December 29, and to call results to Dr. Doran Clay.  4. The patient is to follow up with Dr. Jodi Geralds in two weeks; call for     appointment.      Junie Bame, P.A.                       Ellwood Dense, M.D.    LH/MEDQ  D:  07/27/2003  T:  07/27/2003  Job:  [id number]   cc:   Al Decant. Janey Greaser, MD  [address]  [place]  [place] [address]  Fax: [phone]   Harvie Junior, M.D.  [address]  [place]  [place] [address]  Fax: [phone]

## 2010-12-16 NOTE — Op Note (Signed)
NAME:  VERNIS, EID                      ACCOUNT NO.:  [id number]   MEDICAL RECORD NO.:  [id number]                   PATIENT TYPE:  INP   LOCATION:  6715                                 FACILITY:  [facility]   PHYSICIAN:  Harvie Junior, M.D.                DATE OF BIRTH:  1924-04-18   DATE OF PROCEDURE:  07/08/2003  DATE OF DISCHARGE:                                 OPERATIVE REPORT   PREOPERATIVE DIAGNOSIS:  Four-part fracture of the proximal humerus with a  head-splitting component.   POSTOPERATIVE DIAGNOSIS:  Four-part fracture of the proximal humerus with a  head-splitting component.   OPERATION PERFORMED:  1. Proximal humeral hemiarthroplasty.  2. Repair of rotator cuff.  3. Bicipital tenodesis.   SURGEON:  Harvie Junior, M.D.   ASSISTANT:  Marshia Ly, P.A.   ANESTHESIA:  General.   INDICATIONS FOR PROCEDURE:  The patient is a 77 year old male with a history  of having a fall when he had a fracture of the greater tuberosity, lesser  tuberosity with a separate head piece.  The head was split.  This is what it  appeared by plain x-ray. CT was obtained which showed that he had a head  splitting component with displacement of the fracture fragment.  He was  brought to the operating room at this point for fixation of the fracture  with a prosthesis, repair of the rotator cuff as needed and evaluation of  the biceps tendon.   DESCRIPTION OF PROCEDURE:  The patient was brought to the operating room and  after adequate anesthesia was obtained with general anesthetic, the patient  was placed supine on the operating table.  He was then moved to the beach  chair position.  All bony prominences were well padded.  Attention was then  turned to the left shoulder where after routine prepping and draping, an  incision was made from the coracoid towards the point of the deltoid.  Subcutaneous tissue dissected down to the level of the deltopectoral  interval.  The cephalic vein  was identified and retracted laterally with the  deltoid.  One the deltopectoral interval was divided, the clavipectoral  fascia was identified and a retractor was put in place underneath the  clavipectoral fascia taking the conjoined tendon medially and the deltoid  laterally.  The fracture fragment was then easily identified.  The lesser  tuberosity over the biceps tendon was identified and the rotator interval  was exploited at this point and the lesser tuberosity was retracted  medially.  The greater tuberosity was retracted medially.  The greater  tuberosity was retracted laterally.  The head was removed through this  defect.  It was taken to the back table and measured to be a 48 in diameter.  At this point attention was turned towards the tuberosities where a stay  stitch was put in each.  Attention was then turned to the shaft.  The  guide  DePuy jig was then used to place the component into the fracture component,  the  guide was used to ensure 30 degrees of retroversion of the stem and  once this guide was used, a trial head was put in place and the arm was put  through a range of motion.  Excellent range of motion was achieved.  At this  point three sutures were attached to the shaft, two posterior to the  bicipital tuberosity, one anterior and one posterior to the bicipital  interval and then two anterior to it.  These were for the tuberosities.  At  this point the final stem was cemented into place.  The bone plug was placed  distally.  Following this, hardening of the cement, the arm was trialed  again and a 48 with a 15 mm offset was chosen as the most appropriate range  of motion.  The biceps tendon was found to be somewhat frayed and was  released from the superior labrum for later tenodesis.  At this point the  rotator cuff with the tuberosities were repaired.  Once the cement was  allowed to harden then the stem was put in place, the rotator cuff with the  tuberosities was  repaired to stem, initially repaired to bone and then the  tuberosities were stitched to one another.  The rotator cuff was repaired  superiorly and this excellent repair of the rotator cuff was undertaken.  A  biceps tenodesis was performed by stretching the biceps tendon out to the  appropriate length and then suturing it to the rotator cuff repair.  The  tuberosities were then attached to one another distally and the biceps  tendon was also placed into this closure.  Bone graft from the head was used  underneath the tuberosities prior to their reattachment to the shaft.  At  this point the wound was copiously irrigated and suctioned dry.  The  deltopectoral interval was allowed to fall together.  2-0 Vicryl was used to  close the deltopectoral interval at this point and skin staples to close the  skin.  A sterile compressive dressing was applied at this point and the  patient was taken to the recovery room in an arm sling where he was noted to  be in satisfactory condition.  The estimated blood loss for this procedure  was 200 mL.                                               Harvie Junior, M.D.    Ranae Plumber  D:  07/08/2003  T:  07/09/2003  Job:  [id number]

## 2011-05-03 LAB — CBC
MCHC: 34.4
MCV: 94.3
Platelets: 196
RDW: 13.9

## 2011-05-03 LAB — BASIC METABOLIC PANEL
BUN: 30 — ABNORMAL HIGH
CO2: 29
Calcium: 8.7
Chloride: 105
Creatinine, Ser: 1.13
GFR calc Af Amer: >60

## 2011-05-03 LAB — PROTIME-INR
INR: 1.5
Prothrombin Time: 13.5
Prothrombin Time: 18.3 — ABNORMAL HIGH
Prothrombin Time: 27.1 — ABNORMAL HIGH

## 2011-05-11 ENCOUNTER — Other Ambulatory Visit: Payer: Self-pay | Admitting: Family Medicine

## 2011-05-11 ENCOUNTER — Ambulatory Visit
Admission: RE | Admit: 2011-05-11 | Discharge: 2011-05-11 | Disposition: A | Discharge status: Home / Self Care | Payer: Medicare Other | Source: Ambulatory Visit | Attending: Family Medicine | Admitting: Family Medicine

## 2011-05-11 MED ORDER — IOHEXOL 300 MG/ML  SOLN: 75.0000 mL | Freq: Once | INTRAMUSCULAR | Status: AC | PRN

## 2011-12-14 ENCOUNTER — Ambulatory Visit: Payer: Self-pay | Admitting: Ophthalmology

## 2011-12-14 LAB — POTASSIUM: Potassium: 4.2 mmol/L (ref 3.5–5.1)

## 2011-12-20 ENCOUNTER — Institutional Professional Consult (permissible substitution): Payer: Medicare Other | Admitting: Pulmonary Disease

## 2011-12-22 ENCOUNTER — Ambulatory Visit: Payer: Self-pay | Admitting: Ophthalmology

## 2011-12-22 LAB — PROTIME-INR: INR: 2.1

## 2012-01-09 ENCOUNTER — Encounter: Payer: Self-pay | Admitting: Pulmonary Disease

## 2012-01-09 DIAGNOSIS — G4730 Sleep apnea, unspecified: Secondary | ICD-10-CM

## 2012-01-09 DIAGNOSIS — E785 Hyperlipidemia, unspecified: Secondary | ICD-10-CM | POA: Insufficient documentation

## 2012-01-09 DIAGNOSIS — I2510 Atherosclerotic heart disease of native coronary artery without angina pectoris: Secondary | ICD-10-CM | POA: Insufficient documentation

## 2012-01-09 DIAGNOSIS — I10 Essential (primary) hypertension: Secondary | ICD-10-CM

## 2012-01-10 ENCOUNTER — Encounter: Payer: Self-pay | Admitting: Pulmonary Disease

## 2012-01-10 ENCOUNTER — Ambulatory Visit (INDEPENDENT_AMBULATORY_CARE_PROVIDER_SITE_OTHER): Payer: Medicare Other | Admitting: Pulmonary Disease

## 2012-01-10 VITALS — BP 124/86 | HR 113 | Temp 98.0°F | Ht 65.5 in | Wt 160.0 lb

## 2012-01-10 DIAGNOSIS — G4730 Sleep apnea, unspecified: Secondary | ICD-10-CM

## 2012-01-10 NOTE — Assessment & Plan Note (Signed)
The patient has a history of moderate obstructive sleep apnea, and likely has more severe disease given his age and significant weight gain since the last study.  He is still having classic sleep apnea symptoms, and has underlying cardiac issues.  I suspect that his pressure level was too low, and that is the reason for him feeling suffocation.  I would like to take this opportunity to get him a new machine, and also used the automatic setting in order to optimize his pressure at this time.  I have also encouraged him to work aggressively on weight loss.

## 2012-01-10 NOTE — Patient Instructions (Addendum)
Will get you a new cpap machine, and set on the automatic setting for next few weeks to optimize your pressure.  Will let you know the results. Please call me if having tolerance issues with the device. Work on weight loss followup with me in 6mos.

## 2012-01-10 NOTE — Progress Notes (Signed)
Subjective:    Patient ID: Dale Whitehead, male    DOB: 04-22-1924, 78 y.o.   MRN: [id number]  HPI The patient is an 78 year old male who I have been asked to see for management of obstructive sleep apnea.  The patient was diagnosed with moderate sleep apnea in 2006, with an AHI of 29 events per hour.  He underwent CPAP titration in 2007, with his optimal pressure of 6 cm of water.  The patient was started on CPAP, and last seen in June of 2009, and has not followed up since that time.  He has gained significant weight, and has not been wearing his CPAP machine because of a feeling of suffocation.  He feels the CPAP pressure is not high enough.  He is continuing to have loud snoring according to the family, as well is an abnormal breathing pattern during sleep.  Despite this, he feels that he is fairly rested.  However, he has definite sleep pressure after breakfast, and also in the afternoon.  He can fall asleep easily during these times.  Sleep Questionnaire: What time do you typically go to bed?( Between what hours) 7pm How long does it take you to fall asleep? not long How many times during the night do you wake up? 2 What time do you get out of bed to start your day? 0830 Do you drive or operate heavy machinery in your occupation? No How much has your weight changed (up or down) over the past two years? (In pounds) 20 lb (9.072 kg) Have you ever had a sleep study before? Yes If yes, location of study? [facility] If yes, date of study? 2007 Do you currently use CPAP? No Do you wear oxygen at any time? No     Review of Systems  Constitutional: Negative for fever and unexpected weight change.       Weight gain   HENT: Negative.  Negative for ear pain, nosebleeds, congestion, sore throat, rhinorrhea, sneezing, trouble swallowing, dental problem, postnasal drip and sinus pressure.   Eyes: Negative.  Negative for redness and itching.  Respiratory: Positive for cough. Negative  for chest tightness, shortness of breath and wheezing.   Cardiovascular: Positive for leg swelling. Negative for palpitations.  Gastrointestinal: Negative for nausea and vomiting.       Heartburn   Genitourinary: Negative.  Negative for dysuria.  Musculoskeletal: Positive for arthralgias. Negative for joint swelling.  Skin: Negative.  Negative for rash.  Neurological: Negative.  Negative for headaches.  Hematological: Negative.  Does not bruise/bleed easily.  Psychiatric/Behavioral: Negative for dysphoric mood. The patient is nervous/anxious.        Objective:   Physical Exam Constitutional:  Overweight male, no acute distress  HENT:  Nares patent without discharge  Oropharynx without exudate, palate and uvula are elongated.   Eyes:  Perrla, eomi, no scleral icterus  Neck:  No JVD, no TMG  Cardiovascular:  Normal rate, regular rhythm, no rubs or gallops.  No murmurs        Intact distal pulses  Pulmonary :  Normal breath sounds, no stridor or respiratory distress   No rales, rhonchi, or wheezing  Abdominal:  Soft, nondistended, bowel sounds present.  No tenderness noted.   Musculoskeletal:  1+ lower extremity edema noted.  Lymph Nodes:  No cervical lymphadenopathy noted  Skin:  No cyanosis noted  Neurologic:  Alert, appropriate, moves all 4 extremities without obvious deficit.         Assessment & Plan:

## 2012-02-05 ENCOUNTER — Encounter: Payer: Self-pay | Admitting: Pulmonary Disease

## 2012-02-05 ENCOUNTER — Ambulatory Visit (INDEPENDENT_AMBULATORY_CARE_PROVIDER_SITE_OTHER): Payer: Medicare Other | Admitting: Pulmonary Disease

## 2012-02-05 VITALS — BP 150/82 | HR 97 | Temp 98.0°F | Ht 65.5 in | Wt 160.0 lb

## 2012-02-05 DIAGNOSIS — G4733 Obstructive sleep apnea (adult) (pediatric): Secondary | ICD-10-CM

## 2012-02-05 NOTE — Assessment & Plan Note (Signed)
The patient is having issues with his heated humidifier, and we have not been able to optimize his pressure because of these issues and also recent eye surgery.  He is not tolerating the automatic setting well, and therefore will put him on 10 cm of water pressure, and once he has better tolerance, we will retry the automatic setting.  As far as his humidity issues does, it is clear that he is going to need a heated CPAP hose.  Will order this through his DME company.

## 2012-02-05 NOTE — Progress Notes (Signed)
  Subjective:    Patient ID: Dale Whitehead., male    DOB: 04/16/1924, 78 y.o.   MRN: [id number]  HPI The patient comes in today for ongoing issues with his CPAP.  He has gotten a new CPAP machine, and it has been placed on the automatic setting in order to optimize his pressure.  The patient is having issues with tolerance, and also recently had eye surgery that prohibited him from wearing this consistently.  He has also been having humidity issues, where within a narrow range he can get too much moisture and then not enough.  He also has issues with the air being too cool at the lower setting.   Review of Systems  Constitutional: Negative for fever and unexpected weight change.  HENT: Negative for ear pain, nosebleeds, congestion, sore throat, rhinorrhea, sneezing, trouble swallowing, dental problem, postnasal drip and sinus pressure.   Eyes: Negative for redness and itching.  Respiratory: Positive for cough. Negative for chest tightness, shortness of breath and wheezing.   Cardiovascular: Positive for leg swelling. Negative for palpitations.  Gastrointestinal: Negative for nausea and vomiting.  Genitourinary: Negative for dysuria.  Musculoskeletal: Positive for arthralgias. Negative for joint swelling.  Skin: Negative for rash.  Neurological: Negative for headaches.  Hematological: Does not bruise/bleed easily.  Psychiatric/Behavioral: Negative for dysphoric mood. The patient is not nervous/anxious.   All other systems reviewed and are negative.       Objective:   Physical Exam Obese male in no acute distress No skin breakdown or pressure necrosis from the CPAP mask Lower extremities with no edema, no cyanosis Alert and oriented, moves all 4 extremities.       Assessment & Plan:

## 2012-02-05 NOTE — Patient Instructions (Signed)
Will have your cpap hose changed to one with a heated wire inside.  Then adjust the humidifier to your liking. Will have your machine put on 10cm for now, but call me when you are comfortable with putting the machine back on auto so we can optimize your pressure. Work on weight loss If doing well, keep your followup apptm with me in 6mos.

## 2012-05-30 ENCOUNTER — Other Ambulatory Visit: Payer: Self-pay | Admitting: Cardiovascular Disease

## 2012-05-30 ENCOUNTER — Ambulatory Visit
Admission: RE | Admit: 2012-05-30 | Discharge: 2012-05-30 | Disposition: A | Discharge status: Home / Self Care | Payer: Medicare Other | Source: Ambulatory Visit | Attending: Cardiovascular Disease | Admitting: Cardiovascular Disease

## 2012-05-30 DIAGNOSIS — R0602 Shortness of breath: Secondary | ICD-10-CM

## 2012-07-16 ENCOUNTER — Ambulatory Visit: Payer: Medicare Other | Admitting: Pulmonary Disease

## 2012-11-03 ENCOUNTER — Encounter: Payer: Self-pay | Admitting: *Deleted

## 2012-11-04 ENCOUNTER — Encounter: Payer: Self-pay | Admitting: Cardiovascular Disease

## 2012-12-03 ENCOUNTER — Other Ambulatory Visit (HOSPITAL_COMMUNITY): Payer: Self-pay | Admitting: Cardiovascular Disease

## 2012-12-03 DIAGNOSIS — I509 Heart failure, unspecified: Secondary | ICD-10-CM

## 2012-12-05 ENCOUNTER — Ambulatory Visit (HOSPITAL_COMMUNITY)
Admission: RE | Admit: 2012-12-05 | Discharge: 2012-12-05 | Disposition: A | Discharge status: Home / Self Care | Payer: Medicare Other | Source: Ambulatory Visit | Attending: Cardiovascular Disease | Admitting: Cardiovascular Disease

## 2012-12-05 DIAGNOSIS — I509 Heart failure, unspecified: Secondary | ICD-10-CM | POA: Insufficient documentation

## 2012-12-05 DIAGNOSIS — I079 Rheumatic tricuspid valve disease, unspecified: Secondary | ICD-10-CM | POA: Insufficient documentation

## 2012-12-05 DIAGNOSIS — I059 Rheumatic mitral valve disease, unspecified: Secondary | ICD-10-CM | POA: Insufficient documentation

## 2012-12-05 NOTE — Progress Notes (Signed)
2D Echo Performed 12/05/2012    Oveta Idris, RCS  

## 2012-12-13 ENCOUNTER — Other Ambulatory Visit: Payer: Self-pay | Admitting: *Deleted

## 2012-12-13 ENCOUNTER — Telehealth: Payer: Self-pay | Admitting: Cardiovascular Disease

## 2012-12-13 ENCOUNTER — Telehealth (HOSPITAL_COMMUNITY): Payer: Self-pay | Admitting: Cardiovascular Disease

## 2012-12-13 MED ORDER — LOSARTAN POTASSIUM 50 MG PO TABS: 50.0000 mg | ORAL_TABLET | Freq: Every day | ORAL | Status: DC

## 2012-12-13 NOTE — Telephone Encounter (Signed)
Refill to [facility]

## 2012-12-13 NOTE — Telephone Encounter (Signed)
dgt Gavin Pound called asking for results of echo Mr. Vink had a couple of weeks ago. Patient is sob and has a cough.

## 2012-12-13 NOTE — Telephone Encounter (Signed)
Paper chart requested.

## 2012-12-13 NOTE — Telephone Encounter (Signed)
Returned call.  Spoke w/ Dale Whitehead, pt's daughter.  Stated pt wants to know the results from the echo last week.  Informed Britta Mccreedy, CMA has not received notification of results being read by Dr. Royann Shivers.  Informed they will be notified when results received.  Verbalized understanding and will await call back w/ results.  Dale Whitehead also denied any changes in pt's condition since his last visit.

## 2012-12-13 NOTE — Telephone Encounter (Signed)
Returned call. Left message to call back.

## 2012-12-13 NOTE — Telephone Encounter (Signed)
Gavin Pound is calling for dad test results . Please call back again was unable to receive messages on her phone. Please call the cell number [phone] that is the better number for her. Dont call the home number [phone] she broke her leg and it take her a minute to get to the home phone.   Thanks

## 2012-12-24 ENCOUNTER — Telehealth: Payer: Self-pay | Admitting: *Deleted

## 2012-12-24 NOTE — Telephone Encounter (Signed)
Results of echo called to patient (dtr. Debra),LV normal,but right heart weakened secondary to sleep apnea/lung disease.  Per daughter not using CPAP recommend he restart.  She will see if she can get him to restart QHS

## 2012-12-26 ENCOUNTER — Telehealth: Payer: Self-pay | Admitting: *Deleted

## 2013-01-09 ENCOUNTER — Telehealth: Payer: Self-pay | Admitting: *Deleted

## 2013-01-10 ENCOUNTER — Telehealth: Payer: Self-pay | Admitting: *Deleted

## 2013-01-10 NOTE — Telephone Encounter (Signed)
error 

## 2013-01-10 NOTE — Telephone Encounter (Signed)
Duplicate. Talked w/dtr already

## 2013-01-29 ENCOUNTER — Other Ambulatory Visit: Payer: Self-pay

## 2013-01-29 MED ORDER — TORSEMIDE 20 MG PO TABS: 20.0000 mg | ORAL_TABLET | Freq: Every day | ORAL | Status: DC | PRN

## 2013-01-29 NOTE — Telephone Encounter (Signed)
Rx was sent to pharmacy electronically. 

## 2013-04-01 ENCOUNTER — Ambulatory Visit (INDEPENDENT_AMBULATORY_CARE_PROVIDER_SITE_OTHER): Payer: Medicare Other | Admitting: Pharmacist Clinician (PhC)/ Clinical Pharmacy Specialist

## 2013-04-01 ENCOUNTER — Ambulatory Visit (INDEPENDENT_AMBULATORY_CARE_PROVIDER_SITE_OTHER): Payer: Medicare Other | Admitting: Cardiovascular Disease

## 2013-04-01 ENCOUNTER — Encounter: Payer: Self-pay | Admitting: Cardiovascular Disease

## 2013-04-01 VITALS — BP 130/80 | HR 107 | Resp 24 | Ht 62.0 in

## 2013-04-01 DIAGNOSIS — I749 Embolism and thrombosis of unspecified artery: Secondary | ICD-10-CM | POA: Insufficient documentation

## 2013-04-01 DIAGNOSIS — I509 Heart failure, unspecified: Secondary | ICD-10-CM

## 2013-04-01 DIAGNOSIS — N183 Chronic kidney disease, stage 3 unspecified: Secondary | ICD-10-CM

## 2013-04-01 DIAGNOSIS — G4733 Obstructive sleep apnea (adult) (pediatric): Secondary | ICD-10-CM

## 2013-04-01 DIAGNOSIS — Z86718 Personal history of other venous thrombosis and embolism: Secondary | ICD-10-CM

## 2013-04-01 DIAGNOSIS — I2510 Atherosclerotic heart disease of native coronary artery without angina pectoris: Secondary | ICD-10-CM

## 2013-04-01 DIAGNOSIS — I10 Essential (primary) hypertension: Secondary | ICD-10-CM

## 2013-04-01 DIAGNOSIS — E785 Hyperlipidemia, unspecified: Secondary | ICD-10-CM

## 2013-04-01 DIAGNOSIS — I50810 Right heart failure, unspecified: Secondary | ICD-10-CM

## 2013-04-01 LAB — POCT INR: INR: 2.5

## 2013-04-01 NOTE — Patient Instructions (Addendum)
If swelling of the legs worsens he may temporarily increase the torsemide to 40 mg daily for 2-3 days, until the swelling improves. Whenever increasing the torsemide dose also increase the potassium dose to 20 mEq daily. Your physician recommends that you schedule a follow-up appointment in: 6 months

## 2013-04-06 DIAGNOSIS — I50810 Right heart failure, unspecified: Secondary | ICD-10-CM | POA: Insufficient documentation

## 2013-04-06 DIAGNOSIS — Z86718 Personal history of other venous thrombosis and embolism: Secondary | ICD-10-CM | POA: Insufficient documentation

## 2013-04-06 NOTE — Assessment & Plan Note (Signed)
Intolerant to all the lipid-lowering therapies that have been tried

## 2013-04-06 NOTE — Assessment & Plan Note (Signed)
Good control

## 2013-04-06 NOTE — Assessment & Plan Note (Signed)
Currently receiving CPAP therapy

## 2013-04-06 NOTE — Assessment & Plan Note (Signed)
No angina. No functional study has been performed since 2008 when he had a normal pattern of perfusion

## 2013-04-06 NOTE — Progress Notes (Signed)
Patient ID: BHAVYA GRAND, male   DOB: 1923/11/05, 79 y.o.   MRN: [id number]     Reason for office visit Right heart failure, CAD  Has been little change in Mr. Goldwire clinical status since his last visit in April. It has been almost 20 years since his bypass surgery. He is mostly chair bound. He has chronic shortness of breath. He has chronic problems with lower showed edema and its management has been difficult secondary to worsening renal function.  The cause of his right heart failure is probably multifactorial. Mostly it is related to obstructive sleep apnea. He has preserved left ventricular systolic function. His tachycardia makes it rather difficult to assess diastolic function, and he has not had an echo since 2010. 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  He is statin intolerant and is currently not on any lipid-lowering therapy. Briefly tried to take niacin, zetia and WelChol but could not tolerate these either.     Allergies  Allergen Reactions  . Accupril [Quinapril Hcl]   . Niacin     REACTION: itching  . Paxil [Paroxetine Hcl]   . Statins   . Tamsulosin Hcl   . Tramadol-Acetaminophen     Current Outpatient Prescriptions  Medication Sig Dispense Refill  . ALPRAZolam (XANAX) 0.25 MG tablet Take 0.25 mg by mouth 2 (two) times daily as needed for sleep. Two tablets twice daily as needed      . famotidine (PEPCID) 10 MG tablet Take 10 mg by mouth as needed.       Marland Kitchen HYDROcodone-acetaminophen (NORCO) 5-325 MG per tablet Take 1 tablet by mouth every 8 (eight) hours as needed.      Marland Kitchen losartan (COZAAR) 50 MG tablet Take 1 tablet (50 mg total) by mouth daily.  30 tablet  10  . mirtazapine (REMERON) 15 MG tablet Take 15 mg by mouth at bedtime.      . Multiple Vitamin  (MULTIVITAMIN) tablet Take 1 tablet by mouth daily.      . potassium chloride (K-DUR,KLOR-CON) 10 MEQ tablet Take 10 mEq by mouth daily.       . silodosin (RAPAFLO) 8 MG CAPS capsule Take 8 mg by mouth daily with breakfast.      . torsemide (DEMADEX) 20 MG tablet Take 20 mg by mouth daily.      . Vitamin D, Ergocalciferol, (DRISDOL) 50000 UNITS CAPS capsule Take 50,000 Units by mouth every 7 (seven) days.      Marland Kitchen warfarin (COUMADIN) 4 MG tablet Use as directed       No current facility-administered medications for this visit.    Past Medical History  Diagnosis Date  . HLD (hyperlipidemia)   . HTN (hypertension)   . Sleep apnea   . CAD (coronary artery disease)   . DVT (deep venous thrombosis)     right leg  . Bilateral lower extremity edema     chronic  . Right HF (heart failure)   . S/P CABG (coronary artery bypass graft) 1985    Past Surgical History  Procedure Laterality Date  . Coronary artery bypass graft  1985  . Cataract extraction, bilateral    . Back surgery    . Appendectomy      Family History  Problem Relation Age of Onset  . Heart disease Father   .  Heart disease Son   . Heart disease Paternal Grandfather     History   Social History  . Marital Status: Widowed    Spouse Name: N/A    Number of Children: N/A  . Years of Education: N/A   Occupational History  . RETIRED    Social History Main Topics  . Smoking status: Never Smoker   . Smokeless tobacco: Not on file  . Alcohol Use: No  . Drug Use: No  . Sexual Activity: Not on file   Other Topics Concern  . Not on file   Social History Narrative  . No narrative on file    Review of systems: Chronic dyspnea with minimal exertion, chronic lower showed edema, no chest pain, no cough, no recent fever or chills, no leg pain, ulcer on the right anterior shin without drainage, no focal neurological complaints, no other skin lesions, no urinary complaints, no bowel pain, nausea, vomiting, dysphagia,  gastrointestinal bleeding, other abnormal bleeding, headaches, seizures, changes in mood, intolerance to heat or cold, changes in skin or hair texture appeared  PHYSICAL EXAM BP 130/80  Pulse 107  Resp 24  Ht 5\' 2"  (1.575 m) unable to weigh. He believes he weighs 170 pounds on his home scale. Oxygen saturation 93-94% on room air.  General: Alert, oriented x3, no distress; he is not cyanotic Head: no evidence of trauma, PERRL, EOMI, no exophtalmos or lid lag, no myxedema, no xanthelasma; normal ears, nose and oropharynx Neck: Mildly elevated (6 cm) jugular venous pulsations and mild hepatojugular reflux; brisk carotid pulses without delay and no carotid bruits Chest: clear to auscultation, no signs of consolidation by percussion or palpation, normal fremitus, symmetrical and full respiratory excursions; healed sternotomy scar Cardiovascular: Unable to locate the apical impulse, regular rhythm, normal first and widely split second heart sounds, no murmurs, rubs or gallops Abdomen: no tenderness or distention, no masses by palpation, no abnormal pulsatility or arterial bruits, normal bowel sounds, the liver appears to be mildly enlarged Extremities: no clubbing, cyanosis; 2+ radial, ulnar and brachial pulses bilaterally; 2+ right femoral, posterior tibial and dorsalis pedis pulses; 2+ left femoral, posterior tibial and dorsalis pedis pulses; no subclavian or femoral bruits; there is 3+ pitting edema to the knees bilaterally there is an approximately 2 cm ulcer on the right anterior shin with fairly clean borders and without overt evidence of cellulitis. Neurological: grossly nonfocal   EKG: Sinus tachycardia and chronic right bundle branch block inverted T waves from V1 to V6 possible lateral wall ischemia, little changed from previous tracings  Labs from April 2014 Hemoglobin 15.3, creatinine 1.48 (improved) and normal electrolytes and liver function tests, elevated uric acid 9.0, normal ferritin  and intact PTH, generally normal evaluation for causes of chronic kidney disease except for borderline ANA at 1-160 Lipid profile from December 2013 total cholesterol 316, triglycerides 276, HDL 54, LDL 201   ASSESSMENT AND PLAN Right heart failure Differential pericarditis, occurring 20 years after bypass surgery, cannot be excluded but its diagnosis is unlikely to have an impact on therapy. He is by no means a candidate for pericardiectomy. Most of his right heart failure is probably secondary to obstructive sleep apnea and possibly chronic venous thromboembolic disease. Treatment would primarily consist of adjustment in his diuretic therapy. Unfortunately his renal function needs to be also taken into consideration. I would definitely recommend treating with sufficient diuretic to prevent skin lesions such as the ulcer that is now present on his right shin. Oxygen supplementation as  indicated if he is documented to have persistent hypoxemia. If swelling of the legs worsens he may temporarily increase the torsemide to 40 mg daily for 2-3 days, until the swelling improves. Whenever increasing the torsemide dose also increase the potassium dose to 20 mEq daily  CKD (chronic kidney disease) stage 3, GFR 30-59 ml/min    History of DVT (deep vein thrombosis), recurrent    CAD (coronary artery disease) status post CABG 1985 No angina. No functional study has been performed since 2008 when he had a normal pattern of perfusion  HTN (hypertension) Good control  HLD (hyperlipidemia) Intolerant to all the lipid-lowering therapies that have been tried  OSA (obstructive sleep apnea) Currently receiving CPAP therapy  HYPERTENSION Good control   Orders Placed This Encounter  Procedures  . EKG 12-Lead   Meds ordered this encounter  Medications  . Vitamin D, Ergocalciferol, (DRISDOL) 50000 UNITS CAPS capsule    Sig: Take 50,000 Units by mouth every 7 (seven) days.  Marland Kitchen torsemide (DEMADEX) 20 MG  tablet    Sig: Take 20 mg by mouth daily.    Junious Silk, MD, [facility] [phone] office [phone] pager

## 2013-04-06 NOTE — Assessment & Plan Note (Addendum)
Differential pericarditis, occurring 20 years after bypass surgery, cannot be excluded but its diagnosis is unlikely to have an impact on therapy. He is by no means a candidate for pericardiectomy. Most of his right heart failure is probably secondary to obstructive sleep apnea and possibly chronic venous thromboembolic disease. Treatment would primarily consist of adjustment in his diuretic therapy. Unfortunately his renal function needs to be also taken into consideration. I would definitely recommend treating with sufficient diuretic to prevent skin lesions such as the ulcer that is now present on his right shin. Oxygen supplementation as indicated if he is documented to have persistent hypoxemia. If swelling of the legs worsens he may temporarily increase the torsemide to 40 mg daily for 2-3 days, until the swelling improves. Whenever increasing the torsemide dose also increase the potassium dose to 20 mEq daily

## 2013-04-08 ENCOUNTER — Telehealth: Payer: Self-pay | Admitting: Cardiovascular Disease

## 2013-04-08 MED ORDER — POTASSIUM CHLORIDE CRYS ER 10 MEQ PO TBCR: 20.0000 meq | EXTENDED_RELEASE_TABLET | Freq: Every day | ORAL | Status: DC

## 2013-04-08 NOTE — Telephone Encounter (Signed)
Have we refilled patient's Potassium?  [facility] [address]  [phone].  Request was faxed yesterday.

## 2013-04-08 NOTE — Telephone Encounter (Signed)
Returned call and informed daughter refill sent.  Verbalized understanding and agreed w/ plan.

## 2013-04-08 NOTE — Telephone Encounter (Signed)
Patient calling again about refills.  

## 2013-04-09 ENCOUNTER — Other Ambulatory Visit: Payer: Self-pay | Admitting: *Deleted

## 2013-06-04 ENCOUNTER — Other Ambulatory Visit: Payer: Self-pay

## 2013-06-19 NOTE — Telephone Encounter (Signed)
Refill request faxed from patient's pharmacy - [facility] - for Potassium 20 mEq. Patient is currently taking Potassium 10 mEq 2 tablets daily - it is not time to refill this medication, refill for #60 w/ 11RF was sent in on 04/08/2013.  Prescription request for K+ 20 mEq denied - faxed to pharmacy.

## 2013-08-21 ENCOUNTER — Encounter: Payer: Self-pay | Admitting: Physician Assistant

## 2013-08-21 ENCOUNTER — Ambulatory Visit (INDEPENDENT_AMBULATORY_CARE_PROVIDER_SITE_OTHER): Payer: Medicare Other | Admitting: Physician Assistant

## 2013-08-21 VITALS — BP 140/72 | HR 105 | Ht 66.0 in | Wt 195.0 lb

## 2013-08-21 DIAGNOSIS — R000 Tachycardia, unspecified: Secondary | ICD-10-CM

## 2013-08-21 DIAGNOSIS — R0989 Other specified symptoms and signs involving the circulatory and respiratory systems: Secondary | ICD-10-CM

## 2013-08-21 DIAGNOSIS — N183 Chronic kidney disease, stage 3 unspecified: Secondary | ICD-10-CM

## 2013-08-21 DIAGNOSIS — R0600 Dyspnea, unspecified: Secondary | ICD-10-CM

## 2013-08-21 DIAGNOSIS — I50810 Right heart failure, unspecified: Secondary | ICD-10-CM

## 2013-08-21 DIAGNOSIS — I509 Heart failure, unspecified: Secondary | ICD-10-CM

## 2013-08-21 DIAGNOSIS — G4733 Obstructive sleep apnea (adult) (pediatric): Secondary | ICD-10-CM

## 2013-08-21 DIAGNOSIS — R0609 Other forms of dyspnea: Secondary | ICD-10-CM

## 2013-08-21 DIAGNOSIS — I10 Essential (primary) hypertension: Secondary | ICD-10-CM

## 2013-08-21 MED ORDER — METOLAZONE 2.5 MG PO TABS: 2.5000 mg | ORAL_TABLET | ORAL | Status: DC

## 2013-08-21 NOTE — Patient Instructions (Signed)
1.  start taking Zaroxolyn 30 minutes prior to torsemide. Take tomorrow and Saturday and then start taking every other day.  2.  labs today  3.  followup next week

## 2013-08-21 NOTE — Progress Notes (Signed)
Date:  08/21/2013   ID:  Dale Whitehead Dubow Jr., DOB 1924/07/26, MRN [id number]  PCP:  Mickie HillierLITTLE,KEVIN LORNE, MD  Primary Cardiologist:  Croitoru    History of Present Illness: Dale Whitehead Kaelin Jr. is a 80 y.o. male with a history of right heart failure, coronary artery disease status post coronary artery I passed grafting in 1985, hypertension, hyperlipidemia, sleep apnea, DVT of the right leg, bilateral lower extremity edema, chronic shortness of breath, stage III chronic kidney disease.  His last 2-D echocardiogram was May of 2014 and showed an ejection fraction of 55-60% reduced right ventricle systolic function, peak PA pressure of 19 mmHg.  He is statin intolerant and is currently not on any lipid-lowering therapy.  Briefly tried to take niacin, zetia and WelChol but could not tolerate these either.   Patient presents today for evaluation.   He is in a wheel chair.  His weight is up 35 pounds from July when it was recorded last. He complains of cough, orthopnea, severe lower extremity edema. Patient was seen by Dr. Clarene DukeLittle yesterday and he increased his torsemide.  The patient currently denies nausea, vomiting, fever, chest pain, dizziness, PND, congestion, abdominal pain, hematochezia, melena.  Wt Readings from Last 3 Encounters:  08/21/13 195 lb (88.451 kg)  02/05/12 160 lb (72.576 kg)  01/10/12 160 lb (72.576 kg)     Past Medical History  Diagnosis Date  . HLD (hyperlipidemia)   . HTN (hypertension)   . Sleep apnea   . CAD (coronary artery disease)   . DVT (deep venous thrombosis)     right leg  . Bilateral lower extremity edema     chronic  . Right HF (heart failure)   . S/P CABG (coronary artery bypass graft) 1985    Current Outpatient Prescriptions  Medication Sig Dispense Refill  . ALPRAZolam (XANAX) 0.25 MG tablet Take 0.25 mg by mouth 2 (two) times daily as needed for sleep. Two tablets twice daily as needed      . famotidine (PEPCID) 10 MG tablet Take 10 mg by mouth  as needed.       Marland Kitchen. HYDROcodone-acetaminophen (NORCO) 5-325 MG per tablet Take 1 tablet by mouth every 8 (eight) hours as needed.      Marland Kitchen. losartan (COZAAR) 50 MG tablet Take 1 tablet (50 mg total) by mouth daily.  30 tablet  10  . mirtazapine (REMERON) 15 MG tablet Take 15 mg by mouth at bedtime.      . Multiple Vitamin (MULTIVITAMIN) tablet Take 1 tablet by mouth daily.      . potassium chloride (K-DUR,KLOR-CON) 10 MEQ tablet Take 2 tablets (20 mEq total) by mouth daily.  60 tablet  11  . silodosin (RAPAFLO) 8 MG CAPS capsule Take 8 mg by mouth daily with breakfast.      . torsemide (DEMADEX) 20 MG tablet Take 40 mg by mouth daily.       . Vitamin D, Ergocalciferol, (DRISDOL) 50000 UNITS CAPS capsule Take 50,000 Units by mouth every 7 (seven) days.      Marland Kitchen. warfarin (COUMADIN) 4 MG tablet Use as directed      . metolazone (ZAROXOLYN) 2.5 MG tablet Take 1 tablet (2.5 mg total) by mouth every other day.  30 tablet  3   No current facility-administered medications for this visit.    Allergies:    Allergies  Allergen Reactions  . Accupril [Quinapril Hcl]   . Niacin     REACTION: itching  . Paxil [  Paroxetine Hcl]   . Statins   . Tamsulosin Hcl   . Tramadol-Acetaminophen     Social History:  The patient  reports that he has never smoked. He does not have any smokeless tobacco history on file. He reports that he does not drink alcohol or use illicit drugs.   Family history:   Family History  Problem Relation Age of Onset  . Heart disease Father   . Heart disease Son   . Heart disease Paternal Grandfather     ROS:  Please see the history of present illness.  All other systems reviewed and negative.   PHYSICAL EXAM: VS:  BP 140/72  Pulse 105  Ht 5\' 6"  (1.676 m)  Wt 195 lb (88.451 kg)  BMI 31.49 kg/m2 Obese, well developed, in no acute distress HEENT: Pupils are equal round react to light accommodation extraocular movements are intact.  Neck: no JVDNo cervical  lymphadenopathy. Cardiac: Regular rhythm, rate for without murmurs rubs or gallops. Lungs:  Mild basilar rales no wheezing, rhonchi  Abd: Tense, distended, nontender, positive bowel sounds all quadrants Ext: 3+ lower extremity edema.  2+ radial and dorsalis pedis pulses. Skin: warm and dry Neuro:  Grossly normal  EKG: Sinus tachycardia rate of 105 beats per minute QTC of 496 ms, right bundle branch block  ASSESSMENT AND PLAN:  Problem List Items Addressed This Visit   OSA (obstructive sleep apnea)   HTN (hypertension)     Mildly elevated    Relevant Medications      metolazone (ZAROXOLYN) tablet   CKD (chronic kidney disease) stage 3, GFR 30-59 ml/min   Right heart failure     Patient is extremely volume overloaded secondary to obstructive sleep apnea and right heart failure. He really does not want to go to the hospital.  I am going to add Zaroxolyn 30 minutes prior to his morning torsemide for 2 days and then every other day dosing.   I have asked his daughter to record his daily output and limit his intake to around 1 L per day.  He will followup next week around Thursday.  He's had steady improvement and we'll continue outpatient therapy however, he has not had continual improvement we'll need to admit for IV diuretics.    Relevant Orders      Basic Metabolic Panel (BMET)    Other Visit Diagnoses   Tachycardia    -  Primary    Relevant Orders       EKG 12-Lead    Congestive heart failure        Relevant Orders       B Nat Peptide    Dyspnea        Relevant Orders       B Nat Peptide

## 2013-08-21 NOTE — Assessment & Plan Note (Signed)
Mildly elevated 

## 2013-08-21 NOTE — Assessment & Plan Note (Signed)
Patient is extremely volume overloaded secondary to obstructive sleep apnea and right heart failure. He really does not want to go to the hospital.  I am going to add Zaroxolyn 30 minutes prior to his morning torsemide for 2 days and then every other day dosing.   I have asked his daughter to record his daily output and limit his intake to around 1 L per day.  He will followup next week around Thursday.  He's had steady improvement and we'll continue outpatient therapy however, he has not had continual improvement we'll need to admit for IV diuretics.

## 2013-08-26 ENCOUNTER — Ambulatory Visit (INDEPENDENT_AMBULATORY_CARE_PROVIDER_SITE_OTHER): Payer: Medicare Other | Admitting: Physician Assistant

## 2013-08-26 ENCOUNTER — Encounter: Payer: Self-pay | Admitting: Physician Assistant

## 2013-08-26 VITALS — BP 100/60 | HR 68 | Ht 65.0 in | Wt 198.0 lb

## 2013-08-26 DIAGNOSIS — I10 Essential (primary) hypertension: Secondary | ICD-10-CM

## 2013-08-26 DIAGNOSIS — I50810 Right heart failure, unspecified: Secondary | ICD-10-CM

## 2013-08-26 DIAGNOSIS — I509 Heart failure, unspecified: Secondary | ICD-10-CM

## 2013-08-26 LAB — BASIC METABOLIC PANEL
BUN: 48 mg/dL — ABNORMAL HIGH (ref 6–23)
CO2: 31 mEq/L (ref 19–32)
CREATININE: 2.28 mg/dL — AB (ref 0.50–1.35)
Calcium: 9.8 mg/dL (ref 8.4–10.5)
Chloride: 89 mEq/L — ABNORMAL LOW (ref 96–112)
Glucose, Bld: 140 mg/dL — ABNORMAL HIGH (ref 70–99)
Potassium: 3.4 mEq/L — ABNORMAL LOW (ref 3.5–5.3)
Sodium: 135 mEq/L (ref 135–145)

## 2013-08-26 NOTE — Progress Notes (Signed)
Date:  08/26/2013   ID:  Dale Whitehead., DOB 06/15/1924, MRN [id number]  PCP:  [name], MD  Primary Cardiologist:  Croitoru     History of Present Illness: Dale Whitehead. is a 80 y.o. male, who is essentially chair bound, with a history of right heart failure, coronary artery disease status post coronary artery by passed grafting in 1985, hypertension, hyperlipidemia, sleep apnea, DVT of the right leg, bilateral lower extremity edema, chronic shortness of breath, stage III chronic kidney disease. His last 2-D echocardiogram was May of 2014 and showed an ejection fraction of 55-60% reduced right ventricle systolic function, peak PA pressure of 19 mmHg. He is statin intolerant and is currently not on any lipid-lowering therapy. Briefly tried to take niacin, zetia and WelChol but could not tolerate these either.   I saw patient last week on January 22 at that time his weight had increased by 35 pounds since July and I added metolazone to his torsemide regimen.  At the time he was resistant to going to the hospital for diuresis. He presents today for followup.  His daughter has done very well tracking his ins and outs.  He is about -5L since I saw him las and LEE has improved.  His weight today indicates a weight gain, however, when he was on the scale last time he was holding on to a chair which I believe falsely decreased his weight.  The patient currently denies nausea, vomiting, fever, chest pain, shortness of breath, orthopnea, dizziness, PND, cough, congestion, abdominal pain, hematochezia, melena.  Wt Readings from Last 3 Encounters:  08/26/13 198 lb (89.812 kg)  08/21/13 195 lb (88.451 kg)  02/05/12 160 lb (72.576 kg)     Past Medical History  Diagnosis Date  . HLD (hyperlipidemia)   . HTN (hypertension)   . Sleep apnea   . CAD (coronary artery disease)   . DVT (deep venous thrombosis)     right leg  . Bilateral lower extremity edema     chronic  . Right HF  (heart failure)   . S/P CABG (coronary artery bypass graft) 1985    Current Outpatient Prescriptions  Medication Sig Dispense Refill  . ALPRAZolam (XANAX) 0.25 MG tablet Take 0.25 mg by mouth 2 (two) times daily as needed for sleep. Two tablets twice daily as needed      . clotrimazole (LOTRIMIN) 1 % cream       . famotidine (PEPCID) 10 MG tablet Take 10 mg by mouth as needed.       Marland Kitchen HYDROcodone-acetaminophen (NORCO) 5-325 MG per tablet Take 1 tablet by mouth every 8 (eight) hours as needed.      Marland Kitchen losartan (COZAAR) 50 MG tablet Take 1 tablet (50 mg total) by mouth daily.  30 tablet  10  . metolazone (ZAROXOLYN) 2.5 MG tablet Take 1 tablet (2.5 mg total) by mouth every other day.  30 tablet  3  . mirtazapine (REMERON) 15 MG tablet Take 15 mg by mouth at bedtime.      . Multiple Vitamin (MULTIVITAMIN) tablet Take 1 tablet by mouth daily.      . potassium chloride (K-DUR,KLOR-CON) 10 MEQ tablet Take 2 tablets (20 mEq total) by mouth daily.  60 tablet  11  . silodosin (RAPAFLO) 8 MG CAPS capsule Take 8 mg by mouth daily with breakfast.      . torsemide (DEMADEX) 20 MG tablet Take 40 mg by mouth daily.       Marland Kitchen  Vitamin D, Ergocalciferol, (DRISDOL) 50000 UNITS CAPS capsule Take 50,000 Units by mouth every 7 (seven) days.      Marland Kitchen. warfarin (COUMADIN) 4 MG tablet Use as directed       No current facility-administered medications for this visit.    Allergies:    Allergies  Allergen Reactions  . Accupril [Quinapril Hcl]   . Niacin     REACTION: itching  . Paxil [Paroxetine Hcl]   . Statins   . Tamsulosin Hcl   . Tramadol-Acetaminophen     Social History:  The patient  reports that he has never smoked. He does not have any smokeless tobacco history on file. He reports that he does not drink alcohol or use illicit drugs.   Family history:   Family History  Problem Relation Age of Onset  . Heart disease Father   . Heart disease Son   . Heart disease Paternal Grandfather     ROS:   Please see the history of present illness.  All other systems reviewed and negative.   PHYSICAL EXAM: VS:  BP 100/60  Pulse 68  Ht 5\' 5"  (1.651 m)  Wt 198 lb (89.812 kg)  BMI 32.95 kg/m2 Obese, well developed, in no acute distress HEENT: Pupils are equal round react to light accommodation extraocular movements are intact.  Cardiac: Regular rate and rhythm without murmurs rubs or gallops. Lungs:  clear to auscultation bilaterally, no wheezing, rhonchi or rales Abd: Softer than before but still distended.  Ext: 2+ lower extremity edema.  2+ radial and dorsalis pedis pulses. Skin: warm and dry Neuro:  Grossly normal  ASSESSMENT AND PLAN:  Problem List Items Addressed This Visit   Right heart failure (Chronic)     Patient appears to be diureses quite well with the increase torsemide and metolazone. His lower extremity edema is considerably less than last week.  I am concerned with his chronic kidney disease and diuresing him too much. His BP is on the low side.  I will decrease the torsemide to once daily and continued the metolazone every other day.  Bmet today.      HTN (hypertension)     BP soft.  Torsemide decreased.     Other Visit Diagnoses   Right heart failure, NYHA class 4    -  Primary    Relevant Orders       Basic Metabolic Panel (BMET)

## 2013-08-26 NOTE — Assessment & Plan Note (Signed)
BP soft.  Torsemide decreased.

## 2013-08-26 NOTE — Assessment & Plan Note (Addendum)
Patient appears to be diureses quite well with the increase torsemide and metolazone. His lower extremity edema is considerably less than last week.  I am concerned with his chronic kidney disease and diuresing him too much. His BP is on the low side.  I will decrease the torsemide to once daily and continued the metolazone every other day.  Bmet today.

## 2013-08-26 NOTE — Patient Instructions (Addendum)
1.  decreased torsemide to 20 mg daily. Continue metolazone       2.5 mg every other day. 2. follow up with Dr. Royann Shiversroitoru as scheduled in March.   3.  labs today 4.  If you think he is gaining weight and retaining fluid again, please call for earlier appointment.

## 2013-08-28 ENCOUNTER — Other Ambulatory Visit: Payer: Self-pay | Admitting: Physician Assistant

## 2013-08-28 NOTE — Progress Notes (Signed)
SCr is elevated at 2.28.  Last recorded value I have seen is from 09/2012 and it was 1.81.  I reduced the torsemide during the last office visit.  Shavette Shoaff, PAC

## 2013-08-29 ENCOUNTER — Ambulatory Visit: Payer: Medicare Other | Admitting: Cardiovascular Disease

## 2013-09-26 ENCOUNTER — Telehealth: Payer: Self-pay | Admitting: Cardiovascular Disease

## 2013-09-26 NOTE — Telephone Encounter (Signed)
Want lab results from about a month ago please.

## 2013-09-26 NOTE — Telephone Encounter (Signed)
Returned call and pt verified x 2 w/ pt's daughter, Stanton KidneyDebra.  Informed message received.  Informed of note by Virl DiamondHager, PA-C on 1.29.15 about elevated Cr and that torsemide was decreased at last visit.  Daughter stated pt had a series of labs done in the last month r/t elevated INR while taking abx.  Stated they did tests on everything including checking his kidneys.  Advised she get a copy and bring to appt or have faxed to office before pt's appt on Tuesday, preferably bring w/ her.  Verbalized understanding and agreed w/ plan.  Denied pt is having any problems.

## 2013-09-30 ENCOUNTER — Ambulatory Visit (INDEPENDENT_AMBULATORY_CARE_PROVIDER_SITE_OTHER): Payer: Medicare Other | Admitting: Cardiovascular Disease

## 2013-09-30 ENCOUNTER — Encounter: Payer: Self-pay | Admitting: Cardiovascular Disease

## 2013-09-30 VITALS — BP 102/64 | HR 72 | Resp 20 | Ht 65.0 in

## 2013-09-30 DIAGNOSIS — R5383 Other fatigue: Secondary | ICD-10-CM

## 2013-09-30 DIAGNOSIS — R5381 Other malaise: Secondary | ICD-10-CM

## 2013-09-30 DIAGNOSIS — Z79899 Other long term (current) drug therapy: Secondary | ICD-10-CM

## 2013-09-30 LAB — CBC
HEMATOCRIT: 38.6 % — AB (ref 39.0–52.0)
Hemoglobin: 13.6 g/dL (ref 13.0–17.0)
MCH: 31.3 pg (ref 26.0–34.0)
MCHC: 35.2 g/dL (ref 30.0–36.0)
MCV: 88.9 fL (ref 78.0–100.0)
PLATELETS: 249 10*3/uL (ref 150–400)
RBC: 4.34 MIL/uL (ref 4.22–5.81)
RDW: 13.9 % (ref 11.5–15.5)
WBC: 10.7 10*3/uL — AB (ref 4.0–10.5)

## 2013-09-30 NOTE — Patient Instructions (Signed)
Dr Royann Shiversroitoru has ordered you to have some lab work done TODAY. (BMET and CBC)  Dr Royann Shiversroitoru wants you to follow-up in 6 months. You will receive a reminder letter in the mail two months in advance. If you don't receive a letter, please call our office to schedule the follow-up appointment.

## 2013-10-01 ENCOUNTER — Encounter: Payer: Self-pay | Admitting: Cardiovascular Disease

## 2013-10-01 ENCOUNTER — Telehealth: Payer: Self-pay | Admitting: *Deleted

## 2013-10-01 DIAGNOSIS — Z79899 Other long term (current) drug therapy: Secondary | ICD-10-CM

## 2013-10-01 LAB — BASIC METABOLIC PANEL
BUN: 59 mg/dL — AB (ref 6–23)
CO2: 28 mEq/L (ref 19–32)
Calcium: 9 mg/dL (ref 8.4–10.5)
Chloride: 92 mEq/L — ABNORMAL LOW (ref 96–112)
Creat: 2.34 mg/dL — ABNORMAL HIGH (ref 0.50–1.35)
Glucose, Bld: 84 mg/dL (ref 70–99)
POTASSIUM: 3.4 meq/L — AB (ref 3.5–5.3)
SODIUM: 137 meq/L (ref 135–145)

## 2013-10-01 NOTE — Telephone Encounter (Signed)
Message copied by Vita BarleyLASSITER, Diondre Pulis A on Wed Oct 01, 2013 12:56 PM ------      Message from: Thurmon FairROITORU, MIHAI      Created: Wed Oct 01, 2013  8:03 AM       Kidney function back to where it was in January. I think keep the diuretics where they are,but add another KCL 10 mEq daily (total of 30 mEq daily) ------

## 2013-10-01 NOTE — Progress Notes (Signed)
Patient ID: Cathlean Cower., male   DOB: 1924-02-07, 80 y.o.   MRN: [id number]     Reason for office visit CHF follow up  Makhi Muzquiz. is a 80 y.o. male, who is essentially chair bound, with a history of right heart failure, coronary artery disease status post coronary artery by passed grafting in 1985, hypertension, hyperlipidemia, sleep apnea, DVT of the right leg, bilateral lower extremity edema, chronic shortness of breath, stage III chronic kidney disease. His last 2-D echocardiogram was May of 2014 and showed an ejection fraction of 55-60% reduced right ventricle systolic function. He has a history of recurrent lower extremity DVT in 20 years have passed since his bypass surgery. 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  Roughly one month ago he was seen in office with a 35 pound weight gain, substantial edema, orthopnea and recumbent cough. This improved after addition of metolazone and substantial diuresis. Unfortunately weighing him is very difficult as he is unable to stand without support. Nevertheless there is evidence of substantial diuresis via noticed reduction in his edema. His creatinine showed substantial worsening, peaking at 2.78, and we backed off his diuretic dose. Today he has moderate lower extremity edema minimal dyspnea and is no longer coughing. Repeat lab tests showed that his creatinine has decreased to about 2.2.    Allergies  Allergen Reactions  . Accupril [Quinapril Hcl]   . Niacin     REACTION: itching  . Paxil [Paroxetine Hcl]   . Statins   . Tamsulosin Hcl   . Tramadol-Acetaminophen     Current Outpatient Prescriptions  Medication Sig Dispense Refill  . ALPRAZolam (XANAX) 0.25 MG tablet Take 0.25 mg by mouth 2 (two) times daily as needed for sleep. Two tablets twice daily as needed      . clotrimazole (LOTRIMIN) 1 % cream       .  famotidine (PEPCID) 10 MG tablet Take 10 mg by mouth as needed.       Marland Kitchen HYDROcodone-acetaminophen (NORCO) 5-325 MG per tablet Take 1 tablet by mouth every 8 (eight) hours as needed.      Marland Kitchen losartan (COZAAR) 50 MG tablet Take 1 tablet (50 mg total) by mouth daily.  30 tablet  10  . metolazone (ZAROXOLYN) 2.5 MG tablet Take 1 tablet (2.5 mg total) by mouth every other day.  30 tablet  3  . mirtazapine (REMERON) 15 MG tablet Take 15 mg by mouth at bedtime.      . Multiple Vitamin (MULTIVITAMIN) tablet Take 1 tablet by mouth daily.      . mupirocin ointment (BACTROBAN) 2 % Place 1 application into the nose 2 (two) times daily.      . potassium chloride (K-DUR,KLOR-CON) 10 MEQ tablet Take 2 tablets (20 mEq total) by mouth daily.  60 tablet  11  . silodosin (RAPAFLO) 8 MG CAPS capsule Take 8 mg by mouth daily with breakfast.      . torsemide (DEMADEX) 20 MG tablet Take 20 mg by mouth daily.       . Vitamin D, Ergocalciferol, (DRISDOL) 50000 UNITS CAPS capsule Take 50,000 Units by mouth every 7 (seven) days.      Marland Kitchen warfarin (COUMADIN) 4 MG tablet Use as directed       No current facility-administered medications for this visit.    Past Medical History  Diagnosis Date  . HLD (hyperlipidemia)   .  HTN (hypertension)   . Sleep apnea   . CAD (coronary artery disease)   . DVT (deep venous thrombosis)     right leg  . Bilateral lower extremity edema     chronic  . Right HF (heart failure)   . S/P CABG (coronary artery bypass graft) 1985    Past Surgical History  Procedure Laterality Date  . Coronary artery bypass graft  1985  . Cataract extraction, bilateral    . Back surgery    . Appendectomy      Family History  Problem Relation Age of Onset  . Heart disease Father   . Heart disease Son   . Heart disease Paternal Grandfather     History   Social History  . Marital Status: Widowed    Spouse Name: N/A    Number of Children: N/A  . Years of Education: N/A   Occupational History    . RETIRED    Social History Main Topics  . Smoking status: Never Smoker   . Smokeless tobacco: Not on file  . Alcohol Use: No  . Drug Use: No  . Sexual Activity: Not on file   Other Topics Concern  . Not on file   Social History Narrative  . No narrative on file    Review of systems: He has dyspnea with minimal exertion. The patient specifically denies any chest pain at rest or with exertion, dyspnea at rest, orthopnea, paroxysmal nocturnal dyspnea, syncope, palpitations, focal neurological deficits, intermittent claudication,  cough, hemoptysis or wheezing.  The patient also denies abdominal pain, nausea, vomiting, dysphagia, diarrhea, constipation, polyuria, polydipsia, dysuria, hematuria, frequency, urgency, abnormal bleeding or bruising, fever, chills, unexpected weight changes, mood swings, change in skin or hair texture, change in voice quality, auditory or visual problems, allergic reactions or rashes, new musculoskeletal complaints other than usual "aches and pains".   PHYSICAL EXAM BP 102/64  Pulse 72  Resp 20  Ht 5' 5"  (1.651 m) General: Alert, oriented x3, no distress; he is not cyanotic  Head: no evidence of trauma, PERRL, EOMI, no exophtalmos or lid lag, no myxedema, no xanthelasma; normal ears, nose and oropharynx  Neck: Mildly elevated (6 cm) jugular venous pulsations and mild hepatojugular reflux; brisk carotid pulses without delay and no carotid bruits  Chest: clear to auscultation, no signs of consolidation by percussion or palpation, normal fremitus, symmetrical and full respiratory excursions; healed sternotomy scar  Cardiovascular: Unable to locate the apical impulse, regular rhythm, normal first and widely split second heart sounds, no murmurs, rubs or gallops  Abdomen: no tenderness or distention, no masses by palpation, no abnormal pulsatility or arterial bruits, normal bowel sounds, the liver appears to be mildly enlarged  Extremities: no clubbing,  cyanosis; 2+ radial, ulnar and brachial pulses bilaterally; 2+ right femoral, posterior tibial and dorsalis pedis pulses; 2+ left femoral, posterior tibial and dorsalis pedis pulses; no subclavian or femoral bruits; there is 3+ pitting edema to the knees bilaterally there is an approximately 2 cm ulcer on the right anterior shin with fairly clean borders and without overt evidence of cellulitis.  Neurological: grossly nonfocal    Lipid Panel  No results found for this basename: chol, trig, hdl, cholhdl, vldl, ldlcalc    BMET    Component Value Date/Time   NA 137 09/30/2013 1148   K 3.4* 09/30/2013 1148   CL 92* 09/30/2013 1148   CO2 28 09/30/2013 1148   GLUCOSE 84 09/30/2013 1148   BUN 59* 09/30/2013 1148  CREATININE 2.34* 09/30/2013 1148   CREATININE 1.16 04/12/2009 0600   CALCIUM 9.0 09/30/2013 1148   GFRNONAA >60 04/12/2009 0600   GFRAA  Value: >60        The eGFR has been calculated using the MDRD equation. This calculation has not been validated in all clinical situations. eGFR's persistently <60 mL/min signify possible Chronic Kidney Disease. 04/12/2009 0600     ASSESSMENT AND PLAN  Mr. Givler has evidence of biventricular failure (diastolic left heart failure, systolic right ventricular dysfunction) and management of this disorder is challenging since he also has advanced chronic kidney disease. In addition we are unable to use his weights to monitor changes and volume status since he is unable to stand without support.  I think the current amount of diuretics seems to be maintaining a reasonable balance between renal dysfunction and heart failure symptoms. However I suspect that it'll become harder and harder to maintain his balance. Because of his numerous comorbid conditions I doubt that he will have good quality of life on hemodialysis. I have asked Mr. Viernes and his family to consider this and discuss this option with Dr. Posey Pronto at their upcoming nephrology appointment.  It may be worthwhile  considering home hospice, especially if he does not want to go to hemodialysis.  We'll check labs today to reevaluate renal function and potassium level. I think we will have to do labs on a monthly basis. Continue metolazone 3 times weekly.  Orders Placed This Encounter  Procedures  . Basic metabolic panel  . CBC   Meds ordered this encounter  Medications  . mupirocin ointment (BACTROBAN) 2 %    Sig: Place 1 application into the nose 2 (two) times daily.    Holli Humbles, MD, [place] [phone] office [phone] pager

## 2013-10-01 NOTE — Telephone Encounter (Signed)
Message copied by Vita BarleyLASSITER, Divina Neale A on Wed Oct 01, 2013 12:47 PM ------      Message from: Thurmon FairROITORU, MIHAI      Created: Wed Oct 01, 2013  8:03 AM       Recheck BMET monthly - Home health can draw (I hope) ------

## 2013-10-01 NOTE — Telephone Encounter (Signed)
Lab results given to daughter.  Instructed to increase K+ to qd. Recheck labs in one month.  Order mailed to patients.  Voiced understanding.

## 2013-10-13 ENCOUNTER — Telehealth: Payer: Self-pay | Admitting: Cardiovascular Disease

## 2013-10-13 NOTE — Telephone Encounter (Signed)
Please recheck BMET today or tomorrow

## 2013-10-13 NOTE — Telephone Encounter (Signed)
Please call [phone]

## 2013-10-13 NOTE — Telephone Encounter (Signed)
Returned call to daughter. She states her father usually wakes up to urinate 2-3x each night and did not last night, reports low UOP and poor kidney function. States his potassium was increased to 30mEQ last week with instructions to re-check labs end of this month. Daughter is concerned his potassium levels may be too high. She also states he has appmt with PCP on Wednesday and she is going to contact nephrology office, Dr. Allena KatzPatel, to inform this of this issue as well and see if they have any advise. Daughter also reports that her dad, patient, has developed a cough again (fluid building up per her). Will defer to Dr. Royann Shiversroitoru for advice, but informed daughter that nephrologist would be a good place to seek advice.

## 2013-10-13 NOTE — Telephone Encounter (Signed)
Was not able to use the bathroom at all on last night and he was able to use the bathroom this morning and it was only 100 cc has on 35% of his kidney function left. Per his daughter it is highly unusual for not to go to the bathroom during the night .Marland Kitchen.   Please call

## 2013-10-14 ENCOUNTER — Telehealth: Payer: Self-pay | Admitting: Cardiovascular Disease

## 2013-10-14 NOTE — Telephone Encounter (Signed)
Seen by Dr. Catha GosselinKevin Little yesterday and labs were done.  They will advise on meds.  Patient feels the same but  UOP is up.  Will call Dr. Clarene DukeLittle if any additional problems.

## 2013-10-28 ENCOUNTER — Telehealth: Payer: Self-pay | Admitting: Cardiovascular Disease

## 2013-10-28 LAB — BASIC METABOLIC PANEL
BUN: 41 mg/dL — ABNORMAL HIGH (ref 6–23)
CO2: 29 mEq/L (ref 19–32)
Calcium: 9.5 mg/dL (ref 8.4–10.5)
Chloride: 96 mEq/L (ref 96–112)
Creat: 2.1 mg/dL — ABNORMAL HIGH (ref 0.50–1.35)
Glucose, Bld: 122 mg/dL — ABNORMAL HIGH (ref 70–99)
Potassium: 3.8 mEq/L (ref 3.5–5.3)
Sodium: 139 mEq/L (ref 135–145)

## 2013-10-28 NOTE — Telephone Encounter (Signed)
Spoke to daughter. RN informed her will be able to give results but not instruction on what to do Dr C has not reviewed the labs. RN gave the result number to daughter. RN informed daughter someone will call her once Dr Royann Shiversroitoru review test She verbalized understanding.

## 2013-10-28 NOTE — Telephone Encounter (Signed)
Would like to know the results of his lab work from yesterday. Would also like copies of his lab work from a month ago. Could you please mail them to her. Please call    Thanks

## 2013-10-30 ENCOUNTER — Telehealth: Payer: Self-pay | Admitting: Cardiovascular Disease

## 2013-10-30 MED ORDER — POTASSIUM CHLORIDE CRYS ER 10 MEQ PO TBCR: 30.0000 meq | EXTENDED_RELEASE_TABLET | Freq: Every day | ORAL | Status: DC

## 2013-10-30 MED ORDER — METOLAZONE 2.5 MG PO TABS: ORAL_TABLET | ORAL | Status: DC

## 2013-10-30 NOTE — Telephone Encounter (Signed)
Continue same meds please

## 2013-10-30 NOTE — Telephone Encounter (Signed)
Lab results called and discussed w/daughter told to continue current meds.  Takes K+ daily and needs a refill.  Refills sent in to pharmacy. Requested a copy of the last 2 labs be mailed - done.

## 2013-10-30 NOTE — Telephone Encounter (Signed)
Lab results called and discussed w/daughter told to continue current meds.  Takes K+ 30meq daily and needs a refill.  Refills sent in to pharmacy. Requested a copy of the last 2 labs be mailed - done. 

## 2013-10-30 NOTE — Telephone Encounter (Signed)
Calling to find out the results of the lab work and wants to know where do they go next and would also like to have copies of the lab work . Can send those in the mail .Marland Kitchen. Please call

## 2013-11-05 ENCOUNTER — Telehealth: Payer: Self-pay | Admitting: *Deleted

## 2013-11-05 MED ORDER — POTASSIUM CHLORIDE CRYS ER 10 MEQ PO TBCR: 30.0000 meq | EXTENDED_RELEASE_TABLET | Freq: Every day | ORAL | Status: AC

## 2013-11-05 NOTE — Telephone Encounter (Signed)
Daughter, Dale KidneyDebra, called - picked up potassium refill and only got 60.  New Rx sent for #90 .  She will request additional 30 to get her through the next 30 days.

## 2013-11-20 NOTE — Telephone Encounter (Signed)
Closed encounters °

## 2013-12-09 NOTE — Telephone Encounter (Signed)
Close encounter 

## 2013-12-14 ENCOUNTER — Other Ambulatory Visit: Payer: Self-pay | Admitting: Cardiovascular Disease

## 2013-12-15 NOTE — Telephone Encounter (Signed)
Rx was sent to pharmacy electronically. 

## 2014-01-07 ENCOUNTER — Other Ambulatory Visit: Payer: Self-pay

## 2014-01-07 LAB — PROTIME-INR
INR: 2.2
PROTHROMBIN TIME: 23.9 s — AB (ref 11.5–14.7)

## 2014-01-28 ENCOUNTER — Other Ambulatory Visit: Payer: Self-pay | Admitting: Family Medicine

## 2014-01-30 LAB — URINE CULTURE

## 2014-02-04 ENCOUNTER — Other Ambulatory Visit: Payer: Self-pay

## 2014-02-04 ENCOUNTER — Other Ambulatory Visit: Payer: Self-pay | Admitting: Cardiovascular Disease

## 2014-02-04 LAB — PROTIME-INR
INR: 2.4
Prothrombin Time: 25.9 secs — ABNORMAL HIGH (ref 11.5–14.7)

## 2014-02-05 NOTE — Telephone Encounter (Signed)
Rx refill sent to patient pharmacy   

## 2014-02-25 ENCOUNTER — Other Ambulatory Visit: Payer: Self-pay

## 2014-02-25 LAB — PROTIME-INR
INR: 2.1
Prothrombin Time: 23 secs — ABNORMAL HIGH (ref 11.5–14.7)

## 2014-02-25 LAB — APTT: ACTIVATED PTT: 40.6 s — AB (ref 23.6–35.9)

## 2014-03-17 ENCOUNTER — Telehealth: Payer: Self-pay | Admitting: Cardiovascular Disease

## 2014-03-17 NOTE — Telephone Encounter (Signed)
Please call, Dr Catha GosselinKevin Little have called in Hospice.Dr Clarene DukeLittle office told her to call and talked to you and have you called them.

## 2014-03-17 NOTE — Telephone Encounter (Signed)
Message sent to Dr. Salena Saner to inform him that Dr. Catha GosselinKevin Little has called in Hospice.

## 2014-03-19 ENCOUNTER — Telehealth: Payer: Self-pay | Admitting: Cardiovascular Disease

## 2014-03-19 NOTE — Telephone Encounter (Signed)
FORWARD TO DR CROITORU 

## 2014-03-19 NOTE — Telephone Encounter (Signed)
PER VERBAL ORDER  LABS ARE NECESSARY AT PRESENT TIME PER DR CROITORU. [facility] VERBALIZED UNDERSTANDING.

## 2014-03-19 NOTE — Telephone Encounter (Signed)
Pt will need Dr C to order his lab work so Hospice can draw it. Please call Shelia,his hospice [phone].He will need a CBC and BMET,

## 2014-03-25 ENCOUNTER — Other Ambulatory Visit: Payer: Self-pay

## 2014-03-25 ENCOUNTER — Ambulatory Visit: Payer: Medicare Other | Admitting: Cardiovascular Disease

## 2014-03-25 LAB — PROTIME-INR
INR: 2.3
Prothrombin Time: 24.8 secs — ABNORMAL HIGH (ref 11.5–14.7)

## 2014-03-25 LAB — APTT: ACTIVATED PTT: 39.8 s — AB (ref 23.6–35.9)

## 2014-03-30 ENCOUNTER — Other Ambulatory Visit: Payer: Self-pay | Admitting: Cardiovascular Disease

## 2014-03-30 NOTE — Telephone Encounter (Signed)
Rx was sent to pharmacy electronically. 

## 2014-04-12 ENCOUNTER — Other Ambulatory Visit: Payer: Self-pay | Admitting: Physician Assistant

## 2014-04-13 NOTE — Telephone Encounter (Signed)
Rx refill sent to patient pharmacy   

## 2014-04-14 ENCOUNTER — Inpatient Hospital Stay (HOSPITAL_COMMUNITY)
Admission: EM | Admit: 2014-04-14 | Discharge: 2014-04-20 | DRG: 563 | Disposition: A | Discharge status: Skilled Nursing Facility | Payer: Medicare Other | Attending: Internal Medicine | Admitting: Internal Medicine

## 2014-04-14 DIAGNOSIS — S62101A Fracture of unspecified carpal bone, right wrist, initial encounter for closed fracture: Secondary | ICD-10-CM

## 2014-04-14 DIAGNOSIS — Z23 Encounter for immunization: Secondary | ICD-10-CM

## 2014-04-14 DIAGNOSIS — S62009A Unspecified fracture of navicular [scaphoid] bone of unspecified wrist, initial encounter for closed fracture: Secondary | ICD-10-CM | POA: Diagnosis present

## 2014-04-14 DIAGNOSIS — Z79899 Other long term (current) drug therapy: Secondary | ICD-10-CM

## 2014-04-14 DIAGNOSIS — N183 Chronic kidney disease, stage 3 unspecified: Secondary | ICD-10-CM | POA: Diagnosis present

## 2014-04-14 DIAGNOSIS — E785 Hyperlipidemia, unspecified: Secondary | ICD-10-CM | POA: Diagnosis present

## 2014-04-14 DIAGNOSIS — Y92009 Unspecified place in unspecified non-institutional (private) residence as the place of occurrence of the external cause: Secondary | ICD-10-CM

## 2014-04-14 DIAGNOSIS — Z86718 Personal history of other venous thrombosis and embolism: Secondary | ICD-10-CM

## 2014-04-14 DIAGNOSIS — Z951 Presence of aortocoronary bypass graft: Secondary | ICD-10-CM

## 2014-04-14 DIAGNOSIS — I509 Heart failure, unspecified: Secondary | ICD-10-CM | POA: Diagnosis present

## 2014-04-14 DIAGNOSIS — I749 Embolism and thrombosis of unspecified artery: Secondary | ICD-10-CM

## 2014-04-14 DIAGNOSIS — W010XXA Fall on same level from slipping, tripping and stumbling without subsequent striking against object, initial encounter: Secondary | ICD-10-CM | POA: Diagnosis present

## 2014-04-14 DIAGNOSIS — S42301A Unspecified fracture of shaft of humerus, right arm, initial encounter for closed fracture: Secondary | ICD-10-CM

## 2014-04-14 DIAGNOSIS — S61509A Unspecified open wound of unspecified wrist, initial encounter: Secondary | ICD-10-CM | POA: Diagnosis present

## 2014-04-14 DIAGNOSIS — Z9849 Cataract extraction status, unspecified eye: Secondary | ICD-10-CM

## 2014-04-14 DIAGNOSIS — W19XXXA Unspecified fall, initial encounter: Secondary | ICD-10-CM

## 2014-04-14 DIAGNOSIS — S42213A Unspecified displaced fracture of surgical neck of unspecified humerus, initial encounter for closed fracture: Secondary | ICD-10-CM | POA: Diagnosis not present

## 2014-04-14 DIAGNOSIS — N179 Acute kidney failure, unspecified: Secondary | ICD-10-CM | POA: Diagnosis present

## 2014-04-14 DIAGNOSIS — I10 Essential (primary) hypertension: Secondary | ICD-10-CM | POA: Diagnosis present

## 2014-04-14 DIAGNOSIS — G4730 Sleep apnea, unspecified: Secondary | ICD-10-CM | POA: Diagnosis present

## 2014-04-14 DIAGNOSIS — M25519 Pain in unspecified shoulder: Secondary | ICD-10-CM | POA: Diagnosis not present

## 2014-04-14 DIAGNOSIS — Z7901 Long term (current) use of anticoagulants: Secondary | ICD-10-CM

## 2014-04-14 DIAGNOSIS — I129 Hypertensive chronic kidney disease with stage 1 through stage 4 chronic kidney disease, or unspecified chronic kidney disease: Secondary | ICD-10-CM | POA: Diagnosis present

## 2014-04-14 DIAGNOSIS — I2510 Atherosclerotic heart disease of native coronary artery without angina pectoris: Secondary | ICD-10-CM | POA: Diagnosis present

## 2014-04-14 HISTORY — DX: Shortness of breath: R06.02

## 2014-04-14 HISTORY — DX: Disorder of kidney and ureter, unspecified: N28.9

## 2014-04-14 HISTORY — DX: Unspecified osteoarthritis, unspecified site: M19.90

## 2014-04-14 HISTORY — DX: Cardiac murmur, unspecified: R01.1

## 2014-04-14 HISTORY — DX: Major depressive disorder, single episode, unspecified: F32.9

## 2014-04-14 HISTORY — DX: Family history of other specified conditions: Z84.89

## 2014-04-14 HISTORY — DX: Depression, unspecified: F32.A

## 2014-04-14 HISTORY — DX: Gastro-esophageal reflux disease without esophagitis: K21.9

## 2014-04-14 HISTORY — DX: Heart failure, unspecified: I50.9

## 2014-04-14 HISTORY — DX: Acute myocardial infarction, unspecified: I21.9

## 2014-04-14 HISTORY — DX: Benign prostatic hyperplasia without lower urinary tract symptoms: N40.0

## 2014-04-14 NOTE — ED Notes (Addendum)
Pt. arrived with EMS from home lost his balance while attempting to get up to go to bathroom and fell this evening at home , no LOC / alert and oriented at arrival / respirations unlabored . Reports pain at right shoulder joint / skin tear at left posterior wrist - dressing applied by EMS/ bleeding controlled. Marland Kitchen

## 2014-04-15 ENCOUNTER — Emergency Department (HOSPITAL_COMMUNITY): Payer: Medicare Other

## 2014-04-15 ENCOUNTER — Observation Stay (HOSPITAL_COMMUNITY): Payer: Medicare Other

## 2014-04-15 ENCOUNTER — Encounter (HOSPITAL_COMMUNITY): Payer: Self-pay | Admitting: Emergency Medicine

## 2014-04-15 DIAGNOSIS — N183 Chronic kidney disease, stage 3 unspecified: Secondary | ICD-10-CM | POA: Diagnosis not present

## 2014-04-15 DIAGNOSIS — I749 Embolism and thrombosis of unspecified artery: Secondary | ICD-10-CM

## 2014-04-15 DIAGNOSIS — S42213A Unspecified displaced fracture of surgical neck of unspecified humerus, initial encounter for closed fracture: Secondary | ICD-10-CM | POA: Diagnosis present

## 2014-04-15 DIAGNOSIS — Z86718 Personal history of other venous thrombosis and embolism: Secondary | ICD-10-CM | POA: Diagnosis not present

## 2014-04-15 DIAGNOSIS — S42309A Unspecified fracture of shaft of humerus, unspecified arm, initial encounter for closed fracture: Secondary | ICD-10-CM | POA: Diagnosis not present

## 2014-04-15 DIAGNOSIS — S62109A Fracture of unspecified carpal bone, unspecified wrist, initial encounter for closed fracture: Secondary | ICD-10-CM

## 2014-04-15 DIAGNOSIS — S42301A Unspecified fracture of shaft of humerus, right arm, initial encounter for closed fracture: Secondary | ICD-10-CM | POA: Diagnosis present

## 2014-04-15 LAB — BASIC METABOLIC PANEL
Anion gap: 21 — ABNORMAL HIGH (ref 5–15)
BUN: 55 mg/dL — ABNORMAL HIGH (ref 6–23)
CO2: 23 meq/L (ref 19–32)
Calcium: 8.9 mg/dL (ref 8.4–10.5)
Chloride: 92 mEq/L — ABNORMAL LOW (ref 96–112)
Creatinine, Ser: 1.93 mg/dL — ABNORMAL HIGH (ref 0.50–1.35)
GFR calc Af Amer: 34 mL/min — ABNORMAL LOW (ref >90)
GFR calc non Af Amer: 29 mL/min — ABNORMAL LOW (ref >90)
Glucose, Bld: 183 mg/dL — ABNORMAL HIGH (ref 70–99)
Potassium: 3.4 mEq/L — ABNORMAL LOW (ref 3.7–5.3)
SODIUM: 136 meq/L — AB (ref 137–147)

## 2014-04-15 LAB — URINALYSIS, ROUTINE W REFLEX MICROSCOPIC
Bilirubin Urine: NEGATIVE
Glucose, UA: NEGATIVE mg/dL
Hgb urine dipstick: NEGATIVE
KETONES UR: NEGATIVE mg/dL
LEUKOCYTES UA: NEGATIVE
NITRITE: NEGATIVE
Protein, ur: NEGATIVE mg/dL
SPECIFIC GRAVITY, URINE: 1.019 (ref 1.005–1.030)
Urobilinogen, UA: 0.2 mg/dL (ref 0.0–1.0)
pH: 5.5 (ref 5.0–8.0)

## 2014-04-15 LAB — CBC WITH DIFFERENTIAL/PLATELET
BASOS PCT: 0 % (ref 0–1)
Basophils Absolute: 0 10*3/uL (ref 0.0–0.1)
EOS PCT: 2 % (ref 0–5)
Eosinophils Absolute: 0.2 10*3/uL (ref 0.0–0.7)
HEMATOCRIT: 36.4 % — AB (ref 39.0–52.0)
HEMOGLOBIN: 12.6 g/dL — AB (ref 13.0–17.0)
LYMPHS ABS: 1.1 10*3/uL (ref 0.7–4.0)
Lymphocytes Relative: 9 % — ABNORMAL LOW (ref 12–46)
MCH: 31.8 pg (ref 26.0–34.0)
MCHC: 34.6 g/dL (ref 30.0–36.0)
MCV: 91.9 fL (ref 78.0–100.0)
Monocytes Absolute: 1.1 10*3/uL — ABNORMAL HIGH (ref 0.1–1.0)
Monocytes Relative: 9 % (ref 3–12)
Neutro Abs: 9.8 10*3/uL — ABNORMAL HIGH (ref 1.7–7.7)
Neutrophils Relative %: 80 % — ABNORMAL HIGH (ref 43–77)
Platelets: 186 10*3/uL (ref 150–400)
RBC: 3.96 MIL/uL — ABNORMAL LOW (ref 4.22–5.81)
RDW: 13.7 % (ref 11.5–15.5)
WBC: 12.2 10*3/uL — ABNORMAL HIGH (ref 4.0–10.5)

## 2014-04-15 LAB — TROPONIN I

## 2014-04-15 LAB — PROTIME-INR
INR: 2.3 — ABNORMAL HIGH (ref 0.00–1.49)
Prothrombin Time: 25.3 seconds — ABNORMAL HIGH (ref 11.6–15.2)

## 2014-04-15 LAB — APTT: aPTT: 22 seconds — ABNORMAL LOW (ref 24–37)

## 2014-04-15 MED ORDER — MIRTAZAPINE 15 MG PO TABS
15.0000 mg | ORAL_TABLET | Freq: Every day | ORAL | Status: DC
  Administered 2014-04-15 – 2014-04-19 (×5): 15 mg via ORAL
  Filled 2014-04-15 (×7): qty 1

## 2014-04-15 MED ORDER — DOCUSATE SODIUM 100 MG PO CAPS
300.0000 mg | ORAL_CAPSULE | Freq: Every day | ORAL | Status: DC
  Administered 2014-04-15 – 2014-04-19 (×5): 300 mg via ORAL
  Filled 2014-04-15 (×7): qty 3

## 2014-04-15 MED ORDER — HYDROMORPHONE HCL 1 MG/ML IJ SOLN
1.0000 mg | INTRAMUSCULAR | Status: DC | PRN
  Administered 2014-04-15 (×2): 1 mg via INTRAVENOUS
  Filled 2014-04-15 (×3): qty 1

## 2014-04-15 MED ORDER — TORSEMIDE 20 MG PO TABS
20.0000 mg | ORAL_TABLET | Freq: Every day | ORAL | Status: DC
  Administered 2014-04-15: 20 mg via ORAL
  Filled 2014-04-15 (×2): qty 1

## 2014-04-15 MED ORDER — HYDROMORPHONE HCL 1 MG/ML IJ SOLN
0.5000 mg | Freq: Once | INTRAMUSCULAR | Status: AC
  Administered 2014-04-15: 0.5 mg via INTRAVENOUS

## 2014-04-15 MED ORDER — METOLAZONE 2.5 MG PO TABS
2.5000 mg | ORAL_TABLET | ORAL | Status: DC
  Administered 2014-04-15 – 2014-04-19 (×3): 2.5 mg via ORAL
  Filled 2014-04-15 (×5): qty 1

## 2014-04-15 MED ORDER — WARFARIN SODIUM 6 MG PO TABS
6.0000 mg | ORAL_TABLET | Freq: Once | ORAL | Status: DC
  Filled 2014-04-15: qty 1

## 2014-04-15 MED ORDER — WARFARIN - PHARMACIST DOSING INPATIENT
Freq: Every day | Status: DC
  Administered 2014-04-17 – 2014-04-19 (×2)

## 2014-04-15 MED ORDER — LOSARTAN POTASSIUM 50 MG PO TABS
50.0000 mg | ORAL_TABLET | Freq: Every day | ORAL | Status: DC
  Administered 2014-04-15: 50 mg via ORAL
  Filled 2014-04-15 (×2): qty 1

## 2014-04-15 MED ORDER — INFLUENZA VAC SPLIT QUAD 0.5 ML IM SUSY
0.5000 mL | PREFILLED_SYRINGE | INTRAMUSCULAR | Status: AC
  Administered 2014-04-16: 0.5 mL via INTRAMUSCULAR
  Filled 2014-04-15: qty 0.5

## 2014-04-15 MED ORDER — FENTANYL CITRATE 0.05 MG/ML IJ SOLN
50.0000 ug | Freq: Once | INTRAMUSCULAR | Status: AC
  Administered 2014-04-15: 50 ug via INTRAVENOUS
  Filled 2014-04-15: qty 2

## 2014-04-15 MED ORDER — FENTANYL CITRATE 0.05 MG/ML IJ SOLN
25.0000 ug | Freq: Once | INTRAMUSCULAR | Status: AC
  Administered 2014-04-15: 25 ug via INTRAVENOUS
  Filled 2014-04-15: qty 2

## 2014-04-15 MED ORDER — FAMOTIDINE 10 MG PO TABS
10.0000 mg | ORAL_TABLET | Freq: Every day | ORAL | Status: DC | PRN
  Filled 2014-04-15: qty 1

## 2014-04-15 MED ORDER — SODIUM CHLORIDE 0.9 % IJ SOLN
3.0000 mL | Freq: Two times a day (BID) | INTRAMUSCULAR | Status: DC
  Administered 2014-04-15 – 2014-04-17 (×5): 3 mL via INTRAVENOUS
  Administered 2014-04-18: 11:00:00 via INTRAVENOUS
  Administered 2014-04-18 – 2014-04-20 (×4): 3 mL via INTRAVENOUS

## 2014-04-15 MED ORDER — MORPHINE SULFATE 2 MG/ML IJ SOLN
2.0000 mg | INTRAMUSCULAR | Status: DC | PRN
  Administered 2014-04-15 (×2): 2 mg via INTRAVENOUS
  Filled 2014-04-15 (×2): qty 1

## 2014-04-15 MED ORDER — FENTANYL CITRATE 0.05 MG/ML IJ SOLN
25.0000 ug | Freq: Once | INTRAMUSCULAR | Status: AC
Start: 2014-04-15 — End: 2014-04-15
  Administered 2014-04-15: 25 ug via INTRAVENOUS
  Filled 2014-04-15: qty 2

## 2014-04-15 NOTE — Progress Notes (Signed)
Patient transported to 3E14 via stretcher.  Alert and oriented x4.  Telemetry box applied and CCMD notified. Louretta Parma, RN

## 2014-04-15 NOTE — Progress Notes (Signed)
Orthopedic Tech Progress Note Patient Details:  Dale Whitehead 1924-03-04 [id number]  Ortho Devices Type of Ortho Device: Velcro wrist forearm splint;Sling immobilizer Ortho Device/Splint Interventions: Application   Cammer, Mickie Bail 04/15/2014, 9:52 AM

## 2014-04-15 NOTE — ED Notes (Signed)
Family reports patient is not able to ambulate, or even stand.  They request transportation for home.

## 2014-04-15 NOTE — ED Notes (Signed)
Patient transported to X-ray 

## 2014-04-15 NOTE — Progress Notes (Signed)
Utilization Review Completed.Dale Whitehead T9/16/2015  

## 2014-04-15 NOTE — ED Provider Notes (Signed)
CSN: [id number]     Arrival date & time 04/14/14  2350 History   First MD Initiated Contact with Patient 04/14/14 2352     Chief Complaint  Patient presents with  . Fall     (Consider location/radiation/quality/duration/timing/severity/associated sxs/prior Treatment) HPI Comments: Pt comes in post mechanical fall. Pt has extensive medical hx, including DVT, advanced heart failure, HTN, HL and has Hospice care at home. Pt was trying to shift from chair to a walker, and fell, face first. Pt fell on a rug on a hardwood floor surface. Pt has pain in his head, both shoulders. Pt has mild pain to his left hand as well. Pt takes coumadin. No LOC, no neck pain, no AMS, seizures, vision complains. Pt lives at home with his daughters.  Patient is a 80 y.o. male presenting with fall. The history is provided by the patient.  Fall Associated symptoms include headaches. Pertinent negatives include no chest pain, no abdominal pain and no shortness of breath.    Past Medical History  Diagnosis Date  . HLD (hyperlipidemia)   . HTN (hypertension)   . Sleep apnea   . CAD (coronary artery disease)   . DVT (deep venous thrombosis)     right leg  . Bilateral lower extremity edema     chronic  . Right HF (heart failure)   . S/P CABG (coronary artery bypass graft) 1985  . CHF (congestive heart failure)    Past Surgical History  Procedure Laterality Date  . Coronary artery bypass graft  1985  . Cataract extraction, bilateral    . Back surgery    . Appendectomy     Family History  Problem Relation Age of Onset  . Heart disease Father   . Heart disease Son   . Heart disease Paternal Grandfather    History  Substance Use Topics  . Smoking status: Never Smoker   . Smokeless tobacco: Not on file  . Alcohol Use: No    Review of Systems  Eyes: Negative for visual disturbance.  Respiratory: Negative for shortness of breath.   Cardiovascular: Negative for chest pain.  Gastrointestinal: Negative  for abdominal pain.  Musculoskeletal: Positive for arthralgias and joint swelling.  Skin: Positive for rash and wound.  Neurological: Positive for headaches.  Hematological: Bruises/bleeds easily.      Allergies  Accupril; Erythromycin; Niacin; Paxil; Statins; Tamsulosin hcl; and Tramadol-acetaminophen  Home Medications   Prior to Admission medications   Medication Sig Start Date End Date Taking? Authorizing Provider  acetaminophen (TYLENOL) 500 MG tablet Take 500-1,000 mg by mouth every 6 (six) hours as needed for mild pain.   Yes Historical Provider, MD  clotrimazole (LOTRIMIN) 1 % cream Apply 1 application topically daily as needed (yeast).  07/18/13  Yes Historical Provider, MD  docusate sodium (COLACE) 100 MG capsule Take 300 mg by mouth at bedtime.   Yes Historical Provider, MD  famotidine (PEPCID) 10 MG tablet Take 10 mg by mouth daily as needed for heartburn.    Yes Historical Provider, MD  losartan (COZAAR) 50 MG tablet TAKE 1 TABLET BY MOUTH DAILY   Yes Mihai Croitoru, MD  metolazone (ZAROXOLYN) 2.5 MG tablet Take 2.5 mg by mouth every other day.   Yes Historical Provider, MD  mirtazapine (REMERON) 15 MG tablet Take 15 mg by mouth at bedtime.   Yes Historical Provider, MD  Multiple Vitamin (MULTIVITAMIN) tablet Take 1 tablet by mouth daily.   Yes Historical Provider, MD  mupirocin ointment (BACTROBAN) 2 %  Place 1 application into the nose daily.    Yes Historical Provider, MD  potassium chloride (K-DUR,KLOR-CON) 10 MEQ tablet Take 3 tablets (30 mEq total) by mouth daily. 11/05/13  Yes Mihai Croitoru, MD  silodosin (RAPAFLO) 8 MG CAPS capsule Take 8 mg by mouth daily with breakfast.   Yes Historical Provider, MD  torsemide (DEMADEX) 20 MG tablet Take 20 mg by mouth daily.  01/29/13  Yes Mihai Croitoru, MD  Vitamin D, Ergocalciferol, (DRISDOL) 50000 UNITS CAPS capsule Take 50,000 Units by mouth every 7 (seven) days.   Yes Historical Provider, MD  warfarin (COUMADIN) 4 MG tablet Take  4-6 mg by mouth every evening. Take  everyday except Wednesday take .   Yes Historical Provider, MD   BP 127/72  Pulse 108  Temp(Src) 98.5 F (36.9 C) (Oral)  Resp 15  SpO2 95% Physical Exam  Nursing note and vitals reviewed. Constitutional: He is oriented to person, place, and time. He appears well-developed.  HENT:  Head: Normocephalic and atraumatic.  Eyes: Conjunctivae and EOM are normal. Pupils are equal, round, and reactive to light.  Neck: Normal range of motion. Neck supple.  Cardiovascular: Normal rate and regular rhythm.   Murmur heard. Pulmonary/Chest: Effort normal and breath sounds normal.  Abdominal: Soft. Bowel sounds are normal. He exhibits no distension. There is no tenderness. There is no rebound and no guarding.  Musculoskeletal:  Head to toe evaluation shows + facial hematoma and ecchymoses. + bilateral forearm ecchymoses and hematoma, bilateral shoulder tenderness. No pelvic instability and bilateral leg swelling - chronic with no acute changes. Small abrasions to the LLE. 2+ radial pulse bilaterally.  Neurological: He is alert and oriented to person, place, and time.  Skin: Skin is warm.    ED Course  Procedures (including critical care time) Labs Review Labs Reviewed  CBC WITH DIFFERENTIAL - Abnormal; Notable for the following:    WBC 12.2 (*)    RBC 3.96 (*)    Hemoglobin 12.6 (*)    HCT 36.4 (*)    Neutrophils Relative % 80 (*)    Lymphocytes Relative 9 (*)    Neutro Abs 9.8 (*)    Monocytes Absolute 1.1 (*)    All other components within normal limits  APTT - Abnormal; Notable for the following:    aPTT 22 (*)    All other components within normal limits  PROTIME-INR - Abnormal; Notable for the following:    Prothrombin Time 25.3 (*)    INR 2.30 (*)    All other components within normal limits  BASIC METABOLIC PANEL - Abnormal; Notable for the following:    Sodium 136 (*)    Potassium 3.4 (*)    Chloride 92 (*)    Glucose, Bld 183 (*)     BUN 55 (*)    Creatinine, Ser 1.93 (*)    GFR calc non Af Amer 29 (*)    GFR calc Af Amer 34 (*)    Anion gap 21 (*)    All other components within normal limits  TROPONIN I  URINALYSIS, ROUTINE W REFLEX MICROSCOPIC    Imaging Review Dg Pelvis 1-2 Views  04/15/2014   CLINICAL DATA:  Fall.  Pain in the shoulder.  Less pain in the hips.  EXAM: PELVIS - 1-2 VIEW  COMPARISON:  None.  FINDINGS: Bones appear radiolucent. No evidence for acute fracture or dislocation. Patient has had previous kyphoplasty of the lumbar spine. Penile prosthesis is identified.  IMPRESSION: No evidence for acute  abnormality.   Electronically Signed   By: Rosalie Gums M.D.   On: 04/15/2014 02:09   Dg Shoulder Right  04/15/2014   CLINICAL DATA:  Status post fall.  Right shoulder pain.  EXAM: RIGHT SHOULDER - 2+ VIEW  COMPARISON:  None.  FINDINGS: There is a comminuted fracture involving the right humeral head and neck, with shortening at the fracture site and mild rotation of the humeral head. A smaller lateral humeral head fragment demonstrates minimal displacement. The humeral head remains seated at the glenoid.  Mild degenerative changes noted at the right acromioclavicular joint. No additional fractures are seen. The visualized right lung appears grossly clear, though hypoexpanded.  IMPRESSION: Comminuted fracture involving the right humeral head and neck, with shortening at the fracture site.   Electronically Signed   By: Roanna Raider M.D.   On: 04/15/2014 02:09   Ct Head Wo Contrast  04/15/2014   CLINICAL DATA:  Status post fall.  Headache.  EXAM: CT HEAD WITHOUT CONTRAST  TECHNIQUE: Contiguous axial images were obtained from the base of the skull through the vertex without intravenous contrast.  COMPARISON:  CT of the head performed 05/11/2011  FINDINGS: There is no evidence of acute infarction, mass lesion, or intra- or extra-axial hemorrhage on CT.  Persistent prominence of the ventricles and sulci reflects  moderate to severe cortical volume loss. Mild periventricular and subcortical white matter change likely reflects small vessel ischemic microangiopathy. Cerebellar atrophy is noted.  The brainstem and fourth ventricle are within normal limits. The basal ganglia are unremarkable in appearance. The cerebral hemispheres demonstrate grossly normal gray-white differentiation. No mass effect or midline shift is seen.  There is no evidence of fracture; visualized osseous structures are unremarkable in appearance. The orbits are within normal limits. The paranasal sinuses and mastoid air cells are well-aerated. No significant soft tissue abnormalities are seen.  IMPRESSION: 1. No evidence of traumatic intracranial injury or fracture. 2. Moderate to severe cortical volume loss and scattered small vessel ischemic microangiopathy.   Electronically Signed   By: Roanna Raider M.D.   On: 04/15/2014 02:42   Dg Shoulder Left  04/15/2014   CLINICAL DATA:  Status post fall; mild left shoulder pain.  EXAM: LEFT SHOULDER - 2+ VIEW  COMPARISON:  None.  FINDINGS: There is no evidence of fracture or dislocation. The patient's left shoulder prosthesis appears grossly intact, with mild bony remodeling at the glenoid. There is no evidence of loosening. The acromioclavicular joint is unremarkable in appearance. No significant soft tissue abnormalities are seen. The visualized portions of the left lung are clear.  IMPRESSION: No evidence of fracture or dislocation. Left shoulder prosthesis appear grossly intact, with mild bony remodeling at the glenoid. No evidence of loosening.   Electronically Signed   By: Roanna Raider M.D.   On: 04/15/2014 02:07     EKG Interpretation   Date/Time:  Tuesday April 14 2014 23:57:11 EDT Ventricular Rate:  106 PR Interval:  153 QRS Duration: 130 QT Interval:  386 QTC Calculation: 513 R Axis:   -5 Text Interpretation:  Sinus tachycardia Atrial premature complexes Right  bundle branch block  ST depression lead 1, 2, avL ST elevation avR New  changes appreciated as above Confirmed by Rhunette Croft, MD, Christiann Hagerty [phone]) on  04/15/2014 12:09:26 AM      MDM   Final diagnoses:  Right humeral fracture, closed, initial encounter    DDx includes: - Mechanical falls - ICH - Fractures - Contusions - Soft tissue injury  Pt post fall. Appropriate imaging ordered - and has a comminuted shoulder fx. Spoke with Dr. Roosevelt Locks on call - recommend conservative management, with immobilizer and f/u in 1 week. Pt and family informed. Pt to be ambulated.  5:58 AM Pt unable to ambulate well. Family concerned about his mobility and pain, since he needs his hands to transfer weight and move around. We will admit the patient.   PATIENT'S FAMILY CONFIRMS THAT [facility] DID THE SHOULDER SURGERY. I have asked them to see [facility] upon discharge.   Derwood Kaplan, MD 04/15/14 0600

## 2014-04-15 NOTE — ED Notes (Signed)
Pt states he is unable to walk/ambulate.  Pt states he does not walk at home. He attempted to stand and transfer and fell.

## 2014-04-15 NOTE — ED Notes (Signed)
Unable to ambulate , pt. Declined / family reported he is unable to ambulate at this time .

## 2014-04-15 NOTE — ED Notes (Signed)
Patient transported to CT 

## 2014-04-15 NOTE — H&P (Signed)
[facility] History and Physical  Dale Whitehead. EPP:[id number] DOB: 13-May-1924 DOA: 04/14/2014  Referring physician: Emergency  Department PCP: [name], MD  Specialists: [place]  Chief Complaint: unable to ambulate  HPI: Dale Whitehead. is a 80 y.o. male  With a hx of recurrent DVT on coumadin, HTN, CAD s/p CABG, and hx of chronic R heart failure who presented to the ED with a mechanical fall. In the ED, pt was noted to have a R humeral neck fx. Orthopedic surgery was contacted with recommendations for immobilizer and follow up. A wrist xray was notable for an osseous fragment, suggesting scaphoid fracture. Pelvic xray was unremarkable. The patient was initially planned on d/c home from the ED, however, pt was noted to be unable to ambulate and hospitalist service consulted for admission.  Review of Systems:  Per above, the remainder of the 10pt ros reviewed and are neg  Past Medical History  Diagnosis Date  . HLD (hyperlipidemia)   . HTN (hypertension)   . Sleep apnea   . CAD (coronary artery disease)   . DVT (deep venous thrombosis)     right leg  . Bilateral lower extremity edema     chronic  . Right HF (heart failure)   . S/P CABG (coronary artery bypass graft) 1985  . CHF (congestive heart failure)    Past Surgical History  Procedure Laterality Date  . Coronary artery bypass graft  1985  . Cataract extraction, bilateral    . Back surgery    . Appendectomy     Social History:  reports that he has never smoked. He does not have any smokeless tobacco history on file. He reports that he does not drink alcohol or use illicit drugs.  where does patient live--home, ALF, SNF? and with whom if at home?  Can patient participate in ADLs?  Allergies  Allergen Reactions  . Accupril [Quinapril Hcl] Cough  . Erythromycin Other (See Comments)    Very bad burning  . Niacin     REACTION: itching  . Paxil [Paroxetine Hcl] Other (See Comments)    unknown  .  Statins Other (See Comments)    tremors   . Tamsulosin Hcl Other (See Comments)    "did nothing"  . Tramadol-Acetaminophen Rash    Family History  Problem Relation Age of Onset  . Heart disease Father   . Heart disease Son   . Heart disease Paternal Grandfather     (be sure to complete)  Prior to Admission medications   Medication Sig Start Date End Date Taking? Authorizing Provider  acetaminophen (TYLENOL) 500 MG tablet Take 500-1,000 mg by mouth every 6 (six) hours as needed for mild pain.   Yes Historical Provider, MD  clotrimazole (LOTRIMIN) 1 % cream Apply 1 application topically daily as needed (yeast).  07/18/13  Yes Historical Provider, MD  docusate sodium (COLACE) 100 MG capsule Take 300 mg by mouth at bedtime.   Yes Historical Provider, MD  famotidine (PEPCID) 10 MG tablet Take 10 mg by mouth daily as needed for heartburn.    Yes Historical Provider, MD  losartan (COZAAR) 50 MG tablet TAKE 1 TABLET BY MOUTH DAILY   Yes Mihai Croitoru, MD  metolazone (ZAROXOLYN) 2.5 MG tablet Take 2.5 mg by mouth every other day.   Yes Historical Provider, MD  mirtazapine (REMERON) 15 MG tablet Take 15 mg by mouth at bedtime.   Yes Historical Provider, MD  Multiple Vitamin (MULTIVITAMIN) tablet Take 1 tablet by  mouth daily.   Yes Historical Provider, MD  mupirocin ointment (BACTROBAN) 2 % Place 1 application into the nose daily.    Yes Historical Provider, MD  potassium chloride (K-DUR,KLOR-CON) 10 MEQ tablet Take 3 tablets (30 mEq total) by mouth daily. 11/05/13  Yes Mihai Croitoru, MD  silodosin (RAPAFLO) 8 MG CAPS capsule Take 8 mg by mouth daily with breakfast.   Yes Historical Provider, MD  torsemide (DEMADEX) 20 MG tablet Take 20 mg by mouth daily.  01/29/13  Yes Mihai Croitoru, MD  Vitamin D, Ergocalciferol, (DRISDOL) 50000 UNITS CAPS capsule Take 50,000 Units by mouth every 7 (seven) days.   Yes Historical Provider, MD  warfarin (COUMADIN) 4 MG tablet Take 4-6 mg by mouth every evening. Take   everyday except Wednesday take .   Yes Historical Provider, MD   Physical Exam: Filed Vitals:   04/15/14 0508 04/15/14 0540 04/15/14 0612 04/15/14 0649  BP: 124/69 120/66 126/63 184/85  Pulse: 102 99 102 101  Temp:    98.1 F (36.7 C)  TempSrc:    Oral  Resp: Height:     (1.651 m)  Weight:    99.1 kg (218 lb 7.6 oz)  SpO2: 96% 99% 98% 97%     General:  Awake, in nad  Eyes: PERRL B  ENT: membranes moist, dentition fair  Neck: trachea midline, neck supple  Cardiovascular: regular, s1, s2  Respiratory: normal resp effort, no wheezing  Abdomen: soft,nondistended  Skin: normal skin turgor, multiple patches of ecchymosis  Musculoskeletal: perfused, no clubbing  Psychiatric: mood/affect normal // no auditory/visual hallucinations  Neurologic: cn2-12 grossly intact, strength/sensation intact  Labs on Admission:  Basic Metabolic Panel:  Recent Labs Lab 04/15/14 0047  NA 136*  K 3.4*  CL 92*  CO2 23  GLUCOSE 183*  BUN 55*  CREATININE 1.93*  CALCIUM 8.9   Liver Function Tests: No results found for this basename: AST, ALT, ALKPHOS, BILITOT, PROT, ALBUMIN,  in the last 168 hours No results found for this basename: LIPASE, AMYLASE,  in the last 168 hours No results found for this basename: AMMONIA,  in the last 168 hours CBC:  Recent Labs Lab 04/15/14 0047  WBC 12.2*  NEUTROABS 9.8*  HGB 12.6*  HCT 36.4*  MCV 91.9  PLT 186   Cardiac Enzymes:  Recent Labs Lab 04/15/14 0047  TROPONINI <0.30    BNP (last 3 results) No results found for this basename: PROBNP,  in the last 8760 hours CBG: No results found for this basename: GLUCAP,  in the last 168 hours  Radiological Exams on Admission: Dg Pelvis 1-2 Views  04/15/2014   CLINICAL DATA:  Fall.  Pain in the shoulder.  Less pain in the hips.  EXAM: PELVIS - 1-2 VIEW  COMPARISON:  None.  FINDINGS: Bones appear radiolucent. No evidence for acute fracture or dislocation. Patient has  had previous kyphoplasty of the lumbar spine. Penile prosthesis is identified.  IMPRESSION: No evidence for acute  abnormality.   Electronically Signed   By: Rosalie Gums M.D.   On: 04/15/2014 02:09   Dg Shoulder Right  04/15/2014   CLINICAL DATA:  Status post fall.  Right shoulder pain.  EXAM: RIGHT SHOULDER - 2+ VIEW  COMPARISON:  None.  FINDINGS: There is a comminuted fracture involving the right humeral head and neck, with shortening at the fracture site and mild rotation of the humeral head. A smaller lateral humeral head fragment demonstrates minimal displacement. The  humeral head remains seated at the glenoid.  Mild degenerative changes noted at the right acromioclavicular joint. No additional fractures are seen. The visualized right lung appears grossly clear, though hypoexpanded.  IMPRESSION: Comminuted fracture involving the right humeral head and neck, with shortening at the fracture site.   Electronically Signed   By: Roanna Raider M.D.   On: 04/15/2014 02:09   Dg Wrist Complete Left  04/15/2014   CLINICAL DATA:  Left wrist pain and swelling.  EXAM: LEFT WRIST - COMPLETE 3+ VIEW  COMPARISON:  None.  FINDINGS: There is no evidence of fracture or dislocation. The carpal rows are intact, and demonstrate normal alignment. The joint spaces are preserved.  A focus of calcification radial to the scaphoid likely reflects soft tissue calcification, or may reflect remote injury.  IMPRESSION: No evidence of fracture or dislocation.   Electronically Signed   By: Roanna Raider M.D.   On: 04/15/2014 06:33   Dg Wrist Complete Right  04/15/2014   CLINICAL DATA:  Status post fall.  Right wrist pain.  EXAM: RIGHT WRIST - COMPLETE 3+ VIEW  COMPARISON:  None.  FINDINGS: An osseous fragment is noted along the radial aspect of the scaphoid, raising suspicion for a focal chip fracture from the radial aspect of the scaphoid. This is somewhat unusual. The scaphoid waist appears grossly intact.  The carpal rows are  otherwise grossly unremarkable. Visualized joint spaces are preserved. No significant soft tissue abnormalities are characterized on radiograph.  IMPRESSION: Osseous fragment along the radial aspect of the scaphoid raises suspicion for a focal chip fracture from the radial aspect of the scaphoid. This is a somewhat unusual fracture. The scaphoid waist appears grossly intact, though follow-up wrist radiograph may be performed in 2 weeks to assess for an additional fracture line.   Electronically Signed   By: Roanna Raider M.D.   On: 04/15/2014 04:24   Ct Head Wo Contrast  04/15/2014   CLINICAL DATA:  Status post fall.  Headache.  EXAM: CT HEAD WITHOUT CONTRAST  TECHNIQUE: Contiguous axial images were obtained from the base of the skull through the vertex without intravenous contrast.  COMPARISON:  CT of the head performed 05/11/2011  FINDINGS: There is no evidence of acute infarction, mass lesion, or intra- or extra-axial hemorrhage on CT.  Persistent prominence of the ventricles and sulci reflects moderate to severe cortical volume loss. Mild periventricular and subcortical white matter change likely reflects small vessel ischemic microangiopathy. Cerebellar atrophy is noted.  The brainstem and fourth ventricle are within normal limits. The basal ganglia are unremarkable in appearance. The cerebral hemispheres demonstrate grossly normal gray-white differentiation. No mass effect or midline shift is seen.  There is no evidence of fracture; visualized osseous structures are unremarkable in appearance. The orbits are within normal limits. The paranasal sinuses and mastoid air cells are well-aerated. No significant soft tissue abnormalities are seen.  IMPRESSION: 1. No evidence of traumatic intracranial injury or fracture. 2. Moderate to severe cortical volume loss and scattered small vessel ischemic microangiopathy.   Electronically Signed   By: Roanna Raider M.D.   On: 04/15/2014 02:42   Dg Shoulder  Left  04/15/2014   CLINICAL DATA:  Status post fall; mild left shoulder pain.  EXAM: LEFT SHOULDER - 2+ VIEW  COMPARISON:  None.  FINDINGS: There is no evidence of fracture or dislocation. The patient's left shoulder prosthesis appears grossly intact, with mild bony remodeling at the glenoid. There is no evidence of loosening. The acromioclavicular joint  is unremarkable in appearance. No significant soft tissue abnormalities are seen. The visualized portions of the left lung are clear.  IMPRESSION: No evidence of fracture or dislocation. Left shoulder prosthesis appear grossly intact, with mild bony remodeling at the glenoid. No evidence of loosening.   Electronically Signed   By: Roanna Raider M.D.   On: 04/15/2014 02:07    Assessment/Plan Principal Problem:   Right humeral fracture Active Problems:   HTN (hypertension)   CAD (coronary artery disease) status post CABG 1985   History of DVT (deep vein thrombosis), recurrent   CKD (chronic kidney disease) stage 3, GFR 30-59 ml/min   1. R humeral fracture 1. Cont immobilizer per Ortho recs 2. Orthopedic surgery consulted through ED 2. R scaphoid fracture 1. Orthopedic surgery consulted 2. Radiology recs for follow up wrist xray in 2 weeks to assess for additional fx 3. HTN 1. Had been stable and relatively controlled in ED 4. CAD 1. Stable 5. DVT 1. Continue Coumadin per Pharmacy 6. CKD 1. Baseline Cr of around 2  Code Status: Full (must indicate code status--if unknown or must be presumed, indicate so) Family Communication: Pt and family in room Disposition Plan: Dispo pending  Time spent:  CHIU, Scheryl Marten [facility] Pager [phone]  If 7PM-7AM, please contact night-coverage [email or web address] Password [email or web address] 04/15/2014, 8:21 AM

## 2014-04-15 NOTE — Progress Notes (Signed)
ANTICOAGULATION CONSULT NOTE - Initial Consult  Pharmacy Consult for Coumadin Indication: DVT history  Allergies  Allergen Reactions  . Accupril [Quinapril Hcl] Cough  . Erythromycin Other (See Comments)    Very bad burning  . Niacin     REACTION: itching  . Paxil [Paroxetine Hcl] Other (See Comments)    unknown  . Statins Other (See Comments)    tremors   . Tamsulosin Hcl Other (See Comments)    "did nothing"  . Tramadol-Acetaminophen Rash    Patient Measurements: Height:  (165.1 cm) Weight: 218 lb 7.6 oz (99.1 kg) (bed) IBW/kg (Calculated) : 61.5  Vital Signs: Temp: 98.1 F (36.7 C) (09/16 0649) Temp src: Oral (09/16 0649) BP: 184/85 mmHg (09/16 0649) Pulse Rate: 101 (09/16 0649)  Labs:  Recent Labs  04/15/14 0047  HGB 12.6*  HCT 36.4*  PLT 186  APTT 22*  LABPROT 25.3*  INR 2.30*  CREATININE 1.93*  TROPONINI <0.30    Estimated Creatinine Clearance: 28.6 ml/min (by C-G formula based on Cr of 1.93).   Medical History: Past Medical History  Diagnosis Date  . HLD (hyperlipidemia)   . HTN (hypertension)   . Sleep apnea   . CAD (coronary artery disease)   . DVT (deep venous thrombosis)     right leg  . Bilateral lower extremity edema     chronic  . Right HF (heart failure)   . S/P CABG (coronary artery bypass graft) 1985  . CHF (congestive heart failure)     Assessment: 80 year old male on Coumadin PTA for DVT admitted after a fall and unable to ambulate History of HTN, CAD s/p CABG, and CHF INR on admission therapeutic on home dose of 4 mg daily except 6 mg on Wednesday  Goal of Therapy:  INR 2-3 Monitor platelets by anticoagulation protocol: Yes   Plan:  1) Coumadin 6 mg po x 1 tonight 2) Daily INR  Thank you. Okey Regal, PharmD [phone]  Elwin Sleight 04/15/2014,8:52 AM

## 2014-04-16 DIAGNOSIS — Z9849 Cataract extraction status, unspecified eye: Secondary | ICD-10-CM | POA: Diagnosis not present

## 2014-04-16 DIAGNOSIS — I129 Hypertensive chronic kidney disease with stage 1 through stage 4 chronic kidney disease, or unspecified chronic kidney disease: Secondary | ICD-10-CM | POA: Diagnosis present

## 2014-04-16 DIAGNOSIS — Z7901 Long term (current) use of anticoagulants: Secondary | ICD-10-CM | POA: Diagnosis not present

## 2014-04-16 DIAGNOSIS — Y92009 Unspecified place in unspecified non-institutional (private) residence as the place of occurrence of the external cause: Secondary | ICD-10-CM | POA: Diagnosis not present

## 2014-04-16 DIAGNOSIS — E785 Hyperlipidemia, unspecified: Secondary | ICD-10-CM | POA: Diagnosis present

## 2014-04-16 DIAGNOSIS — Z951 Presence of aortocoronary bypass graft: Secondary | ICD-10-CM | POA: Diagnosis not present

## 2014-04-16 DIAGNOSIS — Z79899 Other long term (current) drug therapy: Secondary | ICD-10-CM | POA: Diagnosis not present

## 2014-04-16 DIAGNOSIS — Z23 Encounter for immunization: Secondary | ICD-10-CM | POA: Diagnosis not present

## 2014-04-16 DIAGNOSIS — M25519 Pain in unspecified shoulder: Secondary | ICD-10-CM | POA: Diagnosis present

## 2014-04-16 DIAGNOSIS — W19XXXA Unspecified fall, initial encounter: Secondary | ICD-10-CM

## 2014-04-16 DIAGNOSIS — S61509A Unspecified open wound of unspecified wrist, initial encounter: Secondary | ICD-10-CM | POA: Diagnosis present

## 2014-04-16 DIAGNOSIS — I2510 Atherosclerotic heart disease of native coronary artery without angina pectoris: Secondary | ICD-10-CM | POA: Diagnosis present

## 2014-04-16 DIAGNOSIS — S62009A Unspecified fracture of navicular [scaphoid] bone of unspecified wrist, initial encounter for closed fracture: Secondary | ICD-10-CM | POA: Diagnosis present

## 2014-04-16 DIAGNOSIS — I509 Heart failure, unspecified: Secondary | ICD-10-CM | POA: Diagnosis present

## 2014-04-16 DIAGNOSIS — Z86718 Personal history of other venous thrombosis and embolism: Secondary | ICD-10-CM | POA: Diagnosis not present

## 2014-04-16 DIAGNOSIS — W010XXA Fall on same level from slipping, tripping and stumbling without subsequent striking against object, initial encounter: Secondary | ICD-10-CM | POA: Diagnosis present

## 2014-04-16 DIAGNOSIS — N183 Chronic kidney disease, stage 3 unspecified: Secondary | ICD-10-CM | POA: Diagnosis present

## 2014-04-16 DIAGNOSIS — G4730 Sleep apnea, unspecified: Secondary | ICD-10-CM | POA: Diagnosis present

## 2014-04-16 DIAGNOSIS — S42213A Unspecified displaced fracture of surgical neck of unspecified humerus, initial encounter for closed fracture: Secondary | ICD-10-CM | POA: Diagnosis present

## 2014-04-16 DIAGNOSIS — N179 Acute kidney failure, unspecified: Secondary | ICD-10-CM | POA: Diagnosis present

## 2014-04-16 LAB — COMPREHENSIVE METABOLIC PANEL
ALT: 23 U/L (ref 0–53)
AST: 37 U/L (ref 0–37)
Albumin: 2.9 g/dL — ABNORMAL LOW (ref 3.5–5.2)
Alkaline Phosphatase: 89 U/L (ref 39–117)
Anion gap: 19 — ABNORMAL HIGH (ref 5–15)
BUN: 59 mg/dL — ABNORMAL HIGH (ref 6–23)
CALCIUM: 8.9 mg/dL (ref 8.4–10.5)
CO2: 25 mEq/L (ref 19–32)
Chloride: 90 mEq/L — ABNORMAL LOW (ref 96–112)
Creatinine, Ser: 2.33 mg/dL — ABNORMAL HIGH (ref 0.50–1.35)
GFR calc non Af Amer: 23 mL/min — ABNORMAL LOW (ref >90)
GFR, EST AFRICAN AMERICAN: 27 mL/min — AB (ref >90)
GLUCOSE: 174 mg/dL — AB (ref 70–99)
Potassium: 3.9 mEq/L (ref 3.7–5.3)
SODIUM: 134 meq/L — AB (ref 137–147)
TOTAL PROTEIN: 7 g/dL (ref 6.0–8.3)
Total Bilirubin: 0.3 mg/dL (ref 0.3–1.2)

## 2014-04-16 LAB — PROTIME-INR
INR: 2.39 — AB (ref 0.00–1.49)
PROTHROMBIN TIME: 26.1 s — AB (ref 11.6–15.2)

## 2014-04-16 LAB — CBC
HCT: 34.3 % — ABNORMAL LOW (ref 39.0–52.0)
HEMOGLOBIN: 11.7 g/dL — AB (ref 13.0–17.0)
MCH: 31.9 pg (ref 26.0–34.0)
MCHC: 34.1 g/dL (ref 30.0–36.0)
MCV: 93.5 fL (ref 78.0–100.0)
Platelets: 215 10*3/uL (ref 150–400)
RBC: 3.67 MIL/uL — AB (ref 4.22–5.81)
RDW: 14 % (ref 11.5–15.5)
WBC: 12.4 10*3/uL — ABNORMAL HIGH (ref 4.0–10.5)

## 2014-04-16 MED ORDER — WARFARIN SODIUM 4 MG PO TABS
4.0000 mg | ORAL_TABLET | Freq: Once | ORAL | Status: DC
  Filled 2014-04-16: qty 1

## 2014-04-16 MED ORDER — TORSEMIDE 20 MG PO TABS
20.0000 mg | ORAL_TABLET | Freq: Every day | ORAL | Status: DC
  Administered 2014-04-17 – 2014-04-20 (×4): 20 mg via ORAL
  Filled 2014-04-16 (×4): qty 1

## 2014-04-16 MED ORDER — OXYCODONE-ACETAMINOPHEN 5-325 MG PO TABS
1.0000 | ORAL_TABLET | ORAL | Status: DC | PRN
  Administered 2014-04-16: 1 via ORAL
  Administered 2014-04-16 (×2): 2 via ORAL
  Administered 2014-04-17: 1 via ORAL
  Administered 2014-04-17 (×3): 2 via ORAL
  Administered 2014-04-18 (×2): 1 via ORAL
  Administered 2014-04-18: 2 via ORAL
  Administered 2014-04-19 – 2014-04-20 (×6): 1 via ORAL
  Filled 2014-04-16 (×3): qty 1
  Filled 2014-04-16: qty 2
  Filled 2014-04-16: qty 1
  Filled 2014-04-16 (×2): qty 2
  Filled 2014-04-16: qty 1
  Filled 2014-04-16: qty 2
  Filled 2014-04-16 (×2): qty 1
  Filled 2014-04-16: qty 2
  Filled 2014-04-16 (×2): qty 1
  Filled 2014-04-16 (×3): qty 2

## 2014-04-16 MED ORDER — SODIUM CHLORIDE 0.9 % IV SOLN
INTRAVENOUS | Status: DC
  Administered 2014-04-16 – 2014-04-17 (×2): 1000 mL via INTRAVENOUS

## 2014-04-16 MED ORDER — LOSARTAN POTASSIUM 50 MG PO TABS
50.0000 mg | ORAL_TABLET | Freq: Every day | ORAL | Status: DC
  Administered 2014-04-17 – 2014-04-20 (×4): 50 mg via ORAL
  Filled 2014-04-16 (×4): qty 1

## 2014-04-16 NOTE — Progress Notes (Signed)
OT Cancellation Note  Patient Details Name: Dale Whitehead. MRN: [id number] DOB: 1923/10/15   Cancelled Treatment:    Reason Eval/Treat Not Completed: Pain limiting ability to participate. Pt's daughter states that he was just given new pain med and requested OT return later. Will re attempt later today as tim eallows  Galen Manila 04/16/2014, 11:12 AM

## 2014-04-16 NOTE — Progress Notes (Signed)
[facility] PROGRESS NOTE  Dale [facility]head. ZOX:[id number] DOB: 1923/11/01 DOA: 04/14/2014 PCP: [name], MD  Assessment/Plan: 1. R humeral fracture  1. Cont immobilizer per Ortho recs 2. Orthopedic surgery consulted through ED with recs for outpatient follow up 2. R scaphoid fracture  1. Orthopedic surgery consulted in ED per above 2. Radiology recs for follow up wrist xray in 2 weeks to assess for additional fx 3. Wrist brace ordered 3. HTN  1. Had been stable and relatively controlled in ED 4. CAD  1. Stable 5. DVT  1. Continue Coumadin per Pharmacy 6. CKD  1. Baseline Cr of around 2 2. Cr today of 2.33  Code Status: Full Family Communication: Pt in room (indicate person spoken with, relationship, and if by phone, the number) Disposition Plan: Pending therapy eval   Consultants:  Orthopedic surgery through ED  Procedures:    Antibiotics:   (indicate start date, and stop date if known)  HPI/Subjective: Continues to report pain, improved with percocet  Objective: Filed Vitals:   04/15/14 2104 04/16/14 0216 04/16/14 0456 04/16/14 0700  BP: 132/43 122/43 127/62   Pulse: 105 61 78   Temp: 98.7 F (37.1 C) 98 F (36.7 C) 98.6 F (37 C) 98.2 F (36.8 C)  TempSrc: Axillary Axillary Oral   Resp: Height:      Weight:   100.7 kg (222 lb 0.1 oz)   SpO2: 92% 93% 94%     Intake/Output Summary (Last 24 hours) at 04/16/14 1230 Last data filed at 04/16/14 1002  Gross per 24 hour  Intake    480 ml  Output   1100 ml  Net   -620 ml   Filed Weights   04/15/14 0649 04/16/14 0456  Weight: 99.1 kg (218 lb 7.6 oz) 100.7 kg (222 lb 0.1 oz)    Exam:   General:  Awake, in nad  Cardiovascular: regular, s1, s2  Respiratory: normal resp effort, no wheezing  Abdomen: soft,nondistended  Musculoskeletal: perfused, no clubbing   Data Reviewed: Basic Metabolic Panel:  Recent Labs Lab 04/15/14 0047 04/16/14 0420  NA 136* 134*  K  3.4* 3.9  CL 92* 90*  CO2 23 25  GLUCOSE 183* 174*  BUN 55* 59*  CREATININE 1.93* 2.33*  CALCIUM 8.9 8.9   Liver Function Tests:  Recent Labs Lab 04/16/14 0420  AST 37  ALT 23  ALKPHOS 89  BILITOT 0.3  PROT 7.0  ALBUMIN 2.9*   No results found for this basename: LIPASE, AMYLASE,  in the last 168 hours No results found for this basename: AMMONIA,  in the last 168 hours CBC:  Recent Labs Lab 04/15/14 0047 04/16/14 0420  WBC 12.2* 12.4*  NEUTROABS 9.8*  --   HGB 12.6* 11.7*  HCT 36.4* 34.3*  MCV 91.9 93.5  PLT 186 215   Cardiac Enzymes:  Recent Labs Lab 04/15/14 0047  TROPONINI <0.30   BNP (last 3 results) No results found for this basename: PROBNP,  in the last 8760 hours CBG: No results found for this basename: GLUCAP,  in the last 168 hours  No results found for this or any previous visit (from the past 240 hour(s)).   Studies: Dg Pelvis 1-2 Views  04/15/2014   CLINICAL DATA:  Fall.  Pain in the shoulder.  Less pain in the hips.  EXAM: PELVIS - 1-2 VIEW  COMPARISON:  None.  FINDINGS: Bones appear radiolucent. No evidence for acute fracture or dislocation. Patient has  had previous kyphoplasty of the lumbar spine. Penile prosthesis is identified.  IMPRESSION: No evidence for acute  abnormality.   Electronically Signed   By: Rosalie Gums M.D.   On: 04/15/2014 02:09   Dg Shoulder Right  04/15/2014   CLINICAL DATA:  Status post fall.  Right shoulder pain.  EXAM: RIGHT SHOULDER - 2+ VIEW  COMPARISON:  None.  FINDINGS: There is a comminuted fracture involving the right humeral head and neck, with shortening at the fracture site and mild rotation of the humeral head. A smaller lateral humeral head fragment demonstrates minimal displacement. The humeral head remains seated at the glenoid.  Mild degenerative changes noted at the right acromioclavicular joint. No additional fractures are seen. The visualized right lung appears grossly clear, though hypoexpanded.  IMPRESSION:  Comminuted fracture involving the right humeral head and neck, with shortening at the fracture site.   Electronically Signed   By: Roanna Raider M.D.   On: 04/15/2014 02:09   Dg Wrist Complete Left  04/15/2014   CLINICAL DATA:  Left wrist pain and swelling.  EXAM: LEFT WRIST - COMPLETE 3+ VIEW  COMPARISON:  None.  FINDINGS: There is no evidence of fracture or dislocation. The carpal rows are intact, and demonstrate normal alignment. The joint spaces are preserved.  A focus of calcification radial to the scaphoid likely reflects soft tissue calcification, or may reflect remote injury.  IMPRESSION: No evidence of fracture or dislocation.   Electronically Signed   By: Roanna Raider M.D.   On: 04/15/2014 06:33   Dg Wrist Complete Right  04/15/2014   CLINICAL DATA:  Status post fall.  Right wrist pain.  EXAM: RIGHT WRIST - COMPLETE 3+ VIEW  COMPARISON:  None.  FINDINGS: An osseous fragment is noted along the radial aspect of the scaphoid, raising suspicion for a focal chip fracture from the radial aspect of the scaphoid. This is somewhat unusual. The scaphoid waist appears grossly intact.  The carpal rows are otherwise grossly unremarkable. Visualized joint spaces are preserved. No significant soft tissue abnormalities are characterized on radiograph.  IMPRESSION: Osseous fragment along the radial aspect of the scaphoid raises suspicion for a focal chip fracture from the radial aspect of the scaphoid. This is a somewhat unusual fracture. The scaphoid waist appears grossly intact, though follow-up wrist radiograph may be performed in 2 weeks to assess for an additional fracture line.   Electronically Signed   By: Roanna Raider M.D.   On: 04/15/2014 04:24   Ct Head Wo Contrast  04/15/2014   CLINICAL DATA:  Status post fall.  Headache.  EXAM: CT HEAD WITHOUT CONTRAST  TECHNIQUE: Contiguous axial images were obtained from the base of the skull through the vertex without intravenous contrast.  COMPARISON:  CT of  the head performed 05/11/2011  FINDINGS: There is no evidence of acute infarction, mass lesion, or intra- or extra-axial hemorrhage on CT.  Persistent prominence of the ventricles and sulci reflects moderate to severe cortical volume loss. Mild periventricular and subcortical white matter change likely reflects small vessel ischemic microangiopathy. Cerebellar atrophy is noted.  The brainstem and fourth ventricle are within normal limits. The basal ganglia are unremarkable in appearance. The cerebral hemispheres demonstrate grossly normal gray-white differentiation. No mass effect or midline shift is seen.  There is no evidence of fracture; visualized osseous structures are unremarkable in appearance. The orbits are within normal limits. The paranasal sinuses and mastoid air cells are well-aerated. No significant soft tissue abnormalities are seen.  IMPRESSION:  1. No evidence of traumatic intracranial injury or fracture. 2. Moderate to severe cortical volume loss and scattered small vessel ischemic microangiopathy.   Electronically Signed   By: Roanna Raider M.D.   On: 04/15/2014 02:42   Dg Shoulder Left  04/15/2014   CLINICAL DATA:  Status post fall; mild left shoulder pain.  EXAM: LEFT SHOULDER - 2+ VIEW  COMPARISON:  None.  FINDINGS: There is no evidence of fracture or dislocation. The patient's left shoulder prosthesis appears grossly intact, with mild bony remodeling at the glenoid. There is no evidence of loosening. The acromioclavicular joint is unremarkable in appearance. No significant soft tissue abnormalities are seen. The visualized portions of the left lung are clear.  IMPRESSION: No evidence of fracture or dislocation. Left shoulder prosthesis appear grossly intact, with mild bony remodeling at the glenoid. No evidence of loosening.   Electronically Signed   By: Roanna Raider M.D.   On: 04/15/2014 02:07    Scheduled Meds: . docusate sodium  300 mg Oral QHS  . [START ON 04/17/2014] losartan  50  mg Oral Daily  . metolazone  2.5 mg Oral QODAY  . mirtazapine  15 mg Oral QHS  . sodium chloride  3 mL Intravenous Q12H  . [START ON 04/17/2014] torsemide  20 mg Oral Daily  . warfarin  4 mg Oral ONCE-1800  . Warfarin - Pharmacist Dosing Inpatient   Does not apply q1800   Continuous Infusions: . sodium chloride 1,000 mL (04/16/14 0911)    Principal Problem:   Right humeral fracture Active Problems:   HTN (hypertension)   CAD (coronary artery disease) status post CABG 1985   History of DVT (deep vein thrombosis), recurrent   CKD (chronic kidney disease) stage 3, GFR 30-59 ml/min   Fx humeral neck  Time spent:  Candon Caras K  [facility] Pager [phone]. If 7PM-7AM, please contact night-coverage at [email or web address], password [facility] 04/16/2014, 12:30 PM  LOS: 2 days

## 2014-04-16 NOTE — Evaluation (Signed)
Physical Therapy Evaluation Patient Details Name: Dale Whitehead. MRN: [id number] DOB: 1924/05/04 Today's Date: 04/16/2014   History of Present Illness  With a hx of recurrent DVT on coumadin, HTN, CAD s/p CABG, and hx of chronic R heart failure who presented to the ED with a mechanical fall. Pt was noted to have a R humeral neck fx and R scaphoid fx.   Clinical Impression  **Pt admitted with fall, R proximal humerus and scaphoid fx*. Pt currently with functional limitations due to the deficits listed below (see PT Problem List).  Pt will benefit from skilled PT to increase their independence and safety with mobility to allow discharge to the venue listed below.   Prior to admission pt had very limited mobility. He's been non-ambulatory for a year per his daughter and has required assist for [facility] transfers and ADLs.  Today he required +2 total assist for supine to sit. He sat on edge of bed x 6 minutes.  SNF recommended.   *    Follow Up Recommendations SNF;Supervision/Assistance - 24 hour    Equipment Recommendations  None recommended by PT    Recommendations for Other Services OT consult     Precautions / Restrictions Precautions Precautions: Fall Precaution Comments: multiple falls in past year Required Braces or Orthoses: Sling;Other Brace/Splint Other Brace/Splint: R wrist splint Restrictions Weight Bearing Restrictions: Yes RUE Weight Bearing: Non weight bearing Other Position/Activity Restrictions: NWB RUE      Mobility  Bed Mobility Overal bed mobility: +2 for physical assistance;Needs Assistance Bed Mobility: Supine to Sit     Supine to sit: +2 for physical assistance;+2 for safety/equipment;Total assist     General bed mobility comments: pt 10%, assist to advance BLEs and to raise trunk  Transfers                 General transfer comment: NT 2* pain, fatigue  Ambulation/Gait                Stairs            Wheelchair Mobility     Modified Rankin (Stroke Patients Only)       Balance Overall balance assessment: Needs assistance   Sitting balance-Leahy Scale: Fair Sitting balance - Comments: pt sat on EOB x 6 minutes, he tended to lean L, required assist to come to neutral then able to maintain neutral Postural control: Left lateral lean                                   Pertinent Vitals/Pain Pain Assessment: 0-10 Pain Score: 8  Pain Location: R shoulder with movement Pain Intervention(s): Limited activity within patient's tolerance;Monitored during session;Premedicated before session;Repositioned    Home Living Family/patient expects to be discharged to:: Private residence Living Arrangements: Children Available Help at Discharge: Family;Available 24 hours/day Type of Home: House Home Access: Ramped entrance     Home Layout: One level Home Equipment: Wheelchair - [employer] - 4 wheels      Prior Function Level of Independence: Needs assistance   Gait / Transfers Assistance Needed: uses lift chair to stand with rollator, has not walked in a year, was able to transfer to [facility] with assist  ADL's / Homemaking Assistance Needed: assist for sponge bath        Hand Dominance        Extremity/Trunk Assessment   Upper Extremity Assessment: RUE deficits/detail;LUE deficits/detail RUE Deficits /  Details: NT -RUE in sling, R wrist in splint     LUE Deficits / Details: edema noted L hand, +3/5 grip, limited shoulder elevatoin PROM to 60* (h/o TSA per family)   Lower Extremity Assessment: Generalized weakness      Cervical / Trunk Assessment: Normal  Communication   Communication: Other (comment) (pt had difficulty answering questions, possibly due to pain medication)  Cognition Arousal/Alertness: Awake/alert Behavior During Therapy: WFL for tasks assessed/performed Overall Cognitive Status: Within Functional Limits for tasks assessed                      General  Comments      Exercises        Assessment/Plan    PT Assessment Patient needs continued PT services  PT Diagnosis Generalized weakness;Acute pain;Difficulty walking   PT Problem List Decreased strength;Decreased activity tolerance;Decreased balance;Pain;Decreased mobility;Obesity  PT Treatment Interventions DME instruction;Functional mobility training;Therapeutic activities;Patient/family education;Balance training;Therapeutic exercise   PT Goals (Current goals can be found in the Care Plan section) Acute Rehab PT Goals Patient Stated Goal: to be able to stand PT Goal Formulation: With patient/family Time For Goal Achievement: 04/30/14 Potential to Achieve Goals: Fair    Frequency Min 3X/week   Barriers to discharge        Co-evaluation               End of Session   Activity Tolerance: Patient limited by pain;Patient limited by fatigue Patient left: in bed;with call bell/phone within reach;with family/visitor present Nurse Communication: Mobility status;Need for lift equipment (air mattress overlay recommended)    Functional Assessment Tool Used: clinical judgement Functional Limitation: Mobility: Walking and moving around Mobility: Walking and Moving Around Current Status [phone]): At least 80 percent but less than 100 percent impaired, limited or restricted Mobility: Walking and Moving Around Goal Status [phone]): At least 60 percent but less than 80 percent impaired, limited or restricted    Time: 1249-1326 PT Time Calculation (min): 37 min   Charges:   PT Evaluation $Initial PT Evaluation Tier I: 1 Procedure PT Treatments $Therapeutic Activity: 23-37 mins   PT G Codes:   Functional Assessment Tool Used: clinical judgement Functional Limitation: Mobility: Walking and moving around    [place] [employer] 04/16/2014, 1:55 PM [phone]

## 2014-04-16 NOTE — Progress Notes (Signed)
PT Cancellation Note  Patient Details Name: Dale Whitehead. MRN: [id number] DOB: 04-27-1924   Cancelled Treatment:    Reason Eval/Treat Not Completed: Pain limiting ability to participate (per daughter pt had adverse reaction to Dilaudid and they are waiting for order of a different pain medication, will re-attempt once pt is medicated for pain as he has 10/10 R shoulder pain). Per daughter, pt has needed assist at home for transfers and ADLs, he's been non-ambulatory for a year. Social work referral for ST-SNF placement recommended. Daughter agreeable.    Ralene Bathe Kistler 04/16/2014, 9:42 AM [phone]

## 2014-04-16 NOTE — Progress Notes (Signed)
ANTICOAGULATION CONSULT NOTE   Pharmacy Consult for Coumadin Indication: DVT history  Allergies  Allergen Reactions  . Accupril [Quinapril Hcl] Cough  . Erythromycin Other (See Comments)    Very bad burning  . Niacin     REACTION: itching  . Paxil [Paroxetine Hcl] Other (See Comments)    unknown  . Statins Other (See Comments)    tremors   . Tamsulosin Hcl Other (See Comments)    "did nothing"  . Tramadol-Acetaminophen Rash    Patient Measurements: Height:  (165.1 cm) Weight: 222 lb 0.1 oz (100.7 kg) IBW/kg (Calculated) : 61.5  Vital Signs: Temp: 98.2 F (36.8 C) (09/17 0700) Temp src: Oral (09/17 0456) BP: 127/62 mmHg (09/17 0456) Pulse Rate: 78 (09/17 0456)  Labs:  Recent Labs  04/15/14 0047 04/16/14 0420  HGB 12.6* 11.7*  HCT 36.4* 34.3*  PLT 186 215  APTT 22*  --   LABPROT 25.3* 26.1*  INR 2.30* 2.39*  CREATININE 1.93* 2.33*  TROPONINI <0.30  --     Estimated Creatinine Clearance: 23.9 ml/min (by C-G formula based on Cr of 2.33).   Assessment: 80 year old male on Coumadin PTA for DVT admitted after a fall and unable to ambulate History of HTN, CAD s/p CABG, and CHF INR therapeutic on home dose of 4 mg daily except 6 mg on Wednesday  Goal of Therapy:  INR 2-3 Monitor platelets by anticoagulation protocol: Yes   Plan:  1) Coumadin 4 mg po x 1 tonight 2) Daily INR  Thank you. Okey Regal, PharmD [phone]  Elwin Sleight 04/16/2014,10:07 AM

## 2014-04-17 ENCOUNTER — Encounter (HOSPITAL_COMMUNITY): Payer: Self-pay | Admitting: Student

## 2014-04-17 DIAGNOSIS — I10 Essential (primary) hypertension: Secondary | ICD-10-CM

## 2014-04-17 LAB — PROTIME-INR
INR: 2.82 — AB (ref 0.00–1.49)
Prothrombin Time: 29.7 seconds — ABNORMAL HIGH (ref 11.6–15.2)

## 2014-04-17 LAB — BASIC METABOLIC PANEL
Anion gap: 13 (ref 5–15)
BUN: 50 mg/dL — ABNORMAL HIGH (ref 6–23)
CHLORIDE: 94 meq/L — AB (ref 96–112)
CO2: 27 mEq/L (ref 19–32)
Calcium: 8.3 mg/dL — ABNORMAL LOW (ref 8.4–10.5)
Creatinine, Ser: 2 mg/dL — ABNORMAL HIGH (ref 0.50–1.35)
GFR calc Af Amer: 33 mL/min — ABNORMAL LOW (ref >90)
GFR calc non Af Amer: 28 mL/min — ABNORMAL LOW (ref >90)
GLUCOSE: 117 mg/dL — AB (ref 70–99)
POTASSIUM: 3.4 meq/L — AB (ref 3.7–5.3)
Sodium: 134 mEq/L — ABNORMAL LOW (ref 137–147)

## 2014-04-17 MED ORDER — BACITRACIN-NEOMYCIN-POLYMYXIN OINTMENT TUBE
TOPICAL_OINTMENT | Freq: Every day | CUTANEOUS | Status: DC
  Administered 2014-04-17 – 2014-04-20 (×3): via TOPICAL
  Filled 2014-04-17 (×2): qty 15

## 2014-04-17 MED ORDER — WARFARIN SODIUM 2 MG PO TABS
2.0000 mg | ORAL_TABLET | Freq: Once | ORAL | Status: AC
  Administered 2014-04-17: 2 mg via ORAL
  Filled 2014-04-17: qty 1

## 2014-04-17 MED ORDER — POLYETHYLENE GLYCOL 3350 17 G PO PACK
17.0000 g | PACK | Freq: Every day | ORAL | Status: DC
  Administered 2014-04-17 – 2014-04-20 (×4): 17 g via ORAL
  Filled 2014-04-17 (×4): qty 1

## 2014-04-17 NOTE — Care Management Note (Signed)
    Page 1 of 1   04/17/2014     3:19:34 PM CARE MANAGEMENT NOTE 04/17/2014  Patient:  Dale Whitehead, Dale Whitehead   Account Number:  [id number]  Date Initiated:  04/17/2014  Documentation initiated by:  Oletta Cohn  Subjective/Objective Assessment:   80 y.o. male  With a hx of recurrent DVT on coumadin, HTN, CAD s/p CABG, and hx of chronic R heart failure who presented to the ED with a mechanical fall.//Home alone     Action/Plan:   Orthopedic surgery was contacted with recommendations for immobilizer and follow up. //PT recommended SNF for patient   Anticipated DC Date:  04/20/2014   Anticipated DC Plan:  SKILLED NURSING FACILITY  In-house referral  Clinical Social Worker      DC Planning Services  CM consult      Choice offered to / List presented to:             Status of service:  Completed, signed off Medicare Important Message given?  YES (If response is "NO", the following Medicare IM given date fields will be blank) Date Medicare IM given:  04/17/2014 Medicare IM given by:  [facility] Date Additional Medicare IM given:   Additional Medicare IM given by:    Discharge Disposition:    Per UR Regulation:  Reviewed for med. necessity/level of care/duration of stay  If discussed at Long Length of Stay Meetings, dates discussed:    Comments:  04/17/14 [address], RN, BSN, [place] [phone] Dale Whitehead, Hospice RN following [facility] visited from [facility]). Phone Number: [phone]. Daughter of patient recommended [facility]. CSW explained that if [facility] was not an option, that they would have to consider other SNF's.

## 2014-04-17 NOTE — Clinical Social Work Psychosocial (Signed)
     Clinical Social Work Department BRIEF PSYCHOSOCIAL ASSESSMENT 04/17/2014  Patient:  Dale, Whitehead     Account Number:  [id number]     Admit date:  04/14/2014  Clinical Social Worker:  Iona Coach  Date/Time:  04/17/2014 01:59 PM  Referred by:  Physician  Date Referred:  04/17/2014 Referred for  SNF Placement   Other Referral:   Patient needs SNF placement   Interview type:  Other - See comment Other interview type:   Patient and daughter    PSYCHOSOCIAL DATA Living Status:  WITH ADULT CHILDREN Admitted from facility:   Level of care:   Primary support name:  Dale Whitehead Primary support relationship to patient:  CHILD, ADULT Degree of support available:   Strong    CURRENT CONCERNS Current Concerns  Other - See comment   Other Concerns:   Patient needs SNF placement    SOCIAL WORK ASSESSMENT / PLAN Clinical Social Worker met with patient and patient's daughter at bedside. Patient's daughter mentioned patient has a connection with social worker Field seismologist of [place], [place]. CSW explained that PT recommended SNF for patient and provided patient and daughter with SNF list. CSW reviewed SNF list with patient and daughter. Daughter of patient recommended placing patient at [facility]. CSW explained that if [place] was not an option, that they would have to consider other SNF's. Patient's daughter was a little reluctant about placing patient at a SNF other than [facility]. CSW also suggested to patient and daughter that if no SNF accepted, that going home with hospice could be an option. Daughter stated that she would review the SNF list with 5 other siblings. Patient and daughter agreed that the patient would benefit from SNF. FL2 completed and placed on chart for MD signature.   Assessment/plan status:  Psychosocial Support/Ongoing Assessment of Needs Other assessment/ plan:    Information/referral to community resources:   CSW provided SNF list the patient and daughter    PATIENTS/FAMILYS RESPONSE TO PLAN OF CARE: Patient lying at bedside alert and oriented. Patient did interact in movement with CSW and nursing staff. Patient and Daughter did agree that SNF was needed, however daughter stated specific preferences but agreed to full search. Daughter would like for patient to be placed at [facility]. CSW explained that there is no guarantee that placement will be at [facility]. Daughter wanted to review SNF list with family.

## 2014-04-17 NOTE — Progress Notes (Signed)
[facility] PROGRESS NOTE  Dale Whitehead. ZOX:[id number] DOB: 01-23-1924 DOA: 04/14/2014 PCP: [name], MD  Assessment/Plan: 1. R humeral fracture  1. Seen by Orthopedic surgery 2. Cont immobilizer per Ortho recs with no therapy to the shoulder for 4-5 weeks, then gentle progressive therapy 3. If shoulder does not heal with persistent pain, reverse total shoulder replacement recommended 2. R scaphoid fracture  1. Orthopedic surgery following 2. Radiology recs for follow up wrist xray in 2 weeks to assess for additional fx 3. Wrist brace continued 3. HTN  1. Had been stable 4. CAD  1. Stable 5. DVT  1. Continue Coumadin per Pharmacy 6. Acute on CKD  1. Baseline Cr of around 2 2. Had risen to 2.33, now improving with gentle IVF  Code Status: Full Family Communication: Pt in room, family at bedside Disposition Plan: Pending therapy eval   Consultants:  Orthopedic surgery through ED  Procedures:    Antibiotics:   (indicate start date, and stop date if known)  HPI/Subjective: Still has shoulder pain. Some improvement with percocet  Objective: Filed Vitals:   04/17/14 0617 04/17/14 0804 04/17/14 0900 04/17/14 1529  BP: 140/90 123/51 139/48 175/98  Pulse: 96 102 93 93  Temp: 99.1 F (37.3 C) 98.2 F (36.8 C)  98.6 F (37 C)  TempSrc: Oral Oral  Oral  Resp: Height:      Weight: 99.85 kg (220 lb 2.1 oz)     SpO2: 99% 95% 94% 95%    Intake/Output Summary (Last 24 hours) at 04/17/14 1642 Last data filed at 04/17/14 1525  Gross per 24 hour  Intake    840 ml  Output   2150 ml  Net  -1310 ml   Filed Weights   04/15/14 0649 04/16/14 0456 04/17/14 0617  Weight: 99.1 kg (218 lb 7.6 oz) 100.7 kg (222 lb 0.1 oz) 99.85 kg (220 lb 2.1 oz)    Exam:   General:  Awake, in nad  Cardiovascular: regular, s1, s2  Respiratory: normal resp effort, no wheezing  Abdomen: soft,nondistended  Musculoskeletal: perfused, no clubbing    Data Reviewed: Basic Metabolic Panel:  Recent Labs Lab 04/15/14 0047 04/16/14 0420 04/17/14 0540  NA 136* 134* 134*  K 3.4* 3.9 3.4*  CL 92* 90* 94*  CO2 GLUCOSE 183* 174* 117*  BUN 55* 59* 50*  CREATININE 1.93* 2.33* 2.00*  CALCIUM 8.9 8.9 8.3*   Liver Function Tests:  Recent Labs Lab 04/16/14 0420  AST 37  ALT 23  ALKPHOS 89  BILITOT 0.3  PROT 7.0  ALBUMIN 2.9*   No results found for this basename: LIPASE, AMYLASE,  in the last 168 hours No results found for this basename: AMMONIA,  in the last 168 hours CBC:  Recent Labs Lab 04/15/14 0047 04/16/14 0420  WBC 12.2* 12.4*  NEUTROABS 9.8*  --   HGB 12.6* 11.7*  HCT 36.4* 34.3*  MCV 91.9 93.5  PLT 186 215   Cardiac Enzymes:  Recent Labs Lab 04/15/14 0047  TROPONINI <0.30   BNP (last 3 results) No results found for this basename: PROBNP,  in the last 8760 hours CBG: No results found for this basename: GLUCAP,  in the last 168 hours  No results found for this or any previous visit (from the past 240 hour(s)).   Studies: No results found.  Scheduled Meds: . docusate sodium  300 mg Oral QHS  . losartan  50 mg Oral Daily  .  metolazone  2.5 mg Oral QODAY  . mirtazapine  15 mg Oral QHS  . neomycin-bacitracin-polymyxin   Topical Daily  . polyethylene glycol  17 g Oral Daily  . sodium chloride  3 mL Intravenous Q12H  . torsemide  20 mg Oral Daily  . warfarin  2 mg Oral ONCE-1800  . Warfarin - Pharmacist Dosing Inpatient   Does not apply q1800   Continuous Infusions: . sodium chloride 1,000 mL (04/17/14 1321)    Principal Problem:   Right humeral fracture Active Problems:   HTN (hypertension)   CAD (coronary artery disease) status post CABG 1985   History of DVT (deep vein thrombosis), recurrent   CKD (chronic kidney disease) stage 3, GFR 30-59 ml/min   Fx humeral neck  Time spent:  CHIU, STEPHEN K  [facility] Pager [phone]. If 7PM-7AM, please contact  night-coverage at [email or web address], password [facility] 04/17/2014, 4:42 PM  LOS: 3 days

## 2014-04-17 NOTE — Progress Notes (Signed)
Orthopedic Tech Progress Note Patient Details:  Dale Whitehead 08-14-1923 [id number]  Ortho Devices Type of Ortho Device: Thumb velcro splint Ortho Device/Splint Location: rue Ortho Device/Splint Interventions: Application   Nikki Dom 04/17/2014, 4:48 PM

## 2014-04-17 NOTE — Progress Notes (Signed)
ANTICOAGULATION CONSULT NOTE   Pharmacy Consult for Coumadin Indication: DVT history  Allergies  Allergen Reactions  . Accupril [Quinapril Hcl] Cough  . Erythromycin Other (See Comments)    Very bad burning  . Niacin     REACTION: itching  . Paxil [Paroxetine Hcl] Other (See Comments)    unknown  . Statins Other (See Comments)    tremors   . Tamsulosin Hcl Other (See Comments)    "did nothing"  . Tramadol-Acetaminophen Rash    Patient Measurements: Height:  (165.1 cm) Weight: 220 lb 2.1 oz (99.85 kg) IBW/kg (Calculated) : 61.5  Vital Signs: Temp: 98.2 F (36.8 C) (09/18 0804) Temp src: Oral (09/18 0804) BP: 123/51 mmHg (09/18 0804) Pulse Rate: 102 (09/18 0804)  Labs:  Recent Labs  04/15/14 0047 04/16/14 0420 04/17/14 0540  HGB 12.6* 11.7*  --   HCT 36.4* 34.3*  --   PLT 186 215  --   APTT 22*  --   --   LABPROT 25.3* 26.1* 29.7*  INR 2.30* 2.39* 2.82*  CREATININE 1.93* 2.33* 2.00*  TROPONINI <0.30  --   --     Estimated Creatinine Clearance: 27.8 ml/min (by C-G formula based on Cr of 2).   Assessment: 80 year old male on Coumadin PTA for DVT admitted after a fall and unable to ambulate History of HTN, CAD s/p CABG, and CHF, INR therapeutic 2.82 today, but coumadin doses were not charted for 9/16 and 9/17, RN not sure if they were given.  Home dose of 4 mg daily except 6 mg on Wednesday  Goal of Therapy:  INR 2-3 Monitor platelets by anticoagulation protocol: Yes   Plan:  1) Coumadin 2 mg po x 1 tonight 2) Daily INR  Bayard Hugger, PharmD, BCPS  Clinical Pharmacist  Pager: [phone]   04/17/2014,8:43 AM

## 2014-04-17 NOTE — Progress Notes (Signed)
Orthopedics Progress Note  Subjective: Asked buy the family to come by and assess the patient for his orthopedic injuries. Patient is currently admitted after a fall in which he broke his right wrist and right shoulder.  Complains of right UE pain.  Objective:  Filed Vitals:   04/17/14 1529  BP: 175/98  Pulse: 93  Temp: 98.6 F (37 C)  Resp: 16    General: Awake and alert  Musculoskeletal: Right shoulder is swollen and tender, not able to assess ROM due to pain, right wrist and hand are swollen, able to wiggle fingers.  Small skin tear on the left hand redressed, no evidence for infection. Neurovascularly intact  Lab Results  Component Value Date   WBC 12.4* 04/16/2014   HGB 11.7* 04/16/2014   HCT 34.3* 04/16/2014   MCV 93.5 04/16/2014   PLT 215 04/16/2014       Component Value Date/Time   NA 134* 04/17/2014 0540   K 3.4* 04/17/2014 0540   CL 94* 04/17/2014 0540   CO2 27 04/17/2014 0540   GLUCOSE 117* 04/17/2014 0540   BUN 50* 04/17/2014 0540   CREATININE 2.00* 04/17/2014 0540   CREATININE 2.10* 10/27/2013 1359   CALCIUM 8.3* 04/17/2014 0540   GFRNONAA 28* 04/17/2014 0540   GFRAA 33* 04/17/2014 0540    Lab Results  Component Value Date   INR 2.82* 04/17/2014   INR 2.39* 04/16/2014   INR 2.30* 04/15/2014    Assessment/Plan: Patient with numerous medical issues and likely not a surgical candidate, who has a displaced right proximal humerus fracture and minimally displaced right scaphoid avulsion fracture.  Patient was under hospice care at home.  I have discussed with the family that the right wrist fracture will heal reliably with a good result.  The shoulder is a tougher to predict.  It is possible that with appropriate rest and immobilization in a sling, that the shoulder could heal with a good functional outcome.  No therapy for the shoulder for four to five weeks, then gentle progressive therapy.  Should the shoulder not heal then for persistent pain, a reverse total shoulder  replacement would likely be the best option for him.  The skin tear on the left wrist should heal with appropriate skin care and dressing changes every two days.  Recommend pain management and supportive care.  SNF placement likely.    Almedia Balls. Ranell Patrick, MD 04/17/2014 4:13 PM

## 2014-04-17 NOTE — Progress Notes (Signed)
OT Cancellation Note  Patient Details Name: Dale Whitehead. MRN: [id number] DOB: 1924/02/21   Cancelled Treatment:    Reason Eval/Treat Not Completed: Pain limiting ability to participate;Other (comment).  OT attempted to see pt twice.  Pt first receiving bath and with nursing, requested OT to return.  Pt later too painful.   Will return tomorrow to complete evaluation.  04/17/2014 Cipriano Mile OTR/L Pager [phone] Office [phone]

## 2014-04-18 LAB — BASIC METABOLIC PANEL
ANION GAP: 13 (ref 5–15)
BUN: 48 mg/dL — AB (ref 6–23)
CHLORIDE: 94 meq/L — AB (ref 96–112)
CO2: 27 mEq/L (ref 19–32)
Calcium: 8 mg/dL — ABNORMAL LOW (ref 8.4–10.5)
Creatinine, Ser: 1.87 mg/dL — ABNORMAL HIGH (ref 0.50–1.35)
GFR calc non Af Amer: 30 mL/min — ABNORMAL LOW (ref >90)
GFR, EST AFRICAN AMERICAN: 35 mL/min — AB (ref >90)
Glucose, Bld: 132 mg/dL — ABNORMAL HIGH (ref 70–99)
POTASSIUM: 4.6 meq/L (ref 3.7–5.3)
SODIUM: 134 meq/L — AB (ref 137–147)

## 2014-04-18 LAB — PROTIME-INR
INR: 1.75 — AB (ref 0.00–1.49)
Prothrombin Time: 20.4 seconds — ABNORMAL HIGH (ref 11.6–15.2)

## 2014-04-18 LAB — GLUCOSE, CAPILLARY: Glucose-Capillary: 117 mg/dL — ABNORMAL HIGH (ref 70–99)

## 2014-04-18 MED ORDER — WARFARIN SODIUM 6 MG PO TABS
6.0000 mg | ORAL_TABLET | Freq: Once | ORAL | Status: AC
  Administered 2014-04-18: 6 mg via ORAL
  Filled 2014-04-18: qty 1

## 2014-04-18 MED ORDER — SORBITOL 70 % SOLN
30.0000 mL | Status: AC
  Administered 2014-04-18: 30 mL via ORAL
  Filled 2014-04-18: qty 30

## 2014-04-18 NOTE — Progress Notes (Signed)
ANTICOAGULATION CONSULT NOTE   Pharmacy Consult for Coumadin Indication: DVT history  Allergies  Allergen Reactions  . Accupril [Quinapril Hcl] Cough  . Erythromycin Other (See Comments)    Very bad burning  . Niacin     REACTION: itching  . Paxil [Paroxetine Hcl] Other (See Comments)    unknown  . Statins Other (See Comments)    tremors   . Tamsulosin Hcl Other (See Comments)    "did nothing"  . Tramadol-Acetaminophen Rash    Patient Measurements: Height:  (165.1 cm) Weight: 224 lb 13.9 oz (102 kg) IBW/kg (Calculated) : 61.5  Vital Signs: Temp: 99.7 F (37.6 C) (09/19 0615) Temp src: Axillary (09/19 0615) BP: 121/71 mmHg (09/19 0615) Pulse Rate: 96 (09/19 0615)  Labs:  Recent Labs  04/16/14 0420 04/17/14 0540 04/18/14 0550 04/18/14 1044  HGB 11.7*  --   --   --   HCT 34.3*  --   --   --   PLT 215  --   --   --   LABPROT 26.1* 29.7* 20.4*  --   INR 2.39* 2.82* 1.75*  --   CREATININE 2.33* 2.00*  --  1.87*    Estimated Creatinine Clearance: 30 ml/min (by C-G formula based on Cr of 1.87).   Assessment: 80 year old male on Coumadin PTA for DVT admitted after a fall and unable to ambulate History of HTN, CAD s/p CABG, and CHF, INR therapeutic 2.82 > 1.75 today, but coumadin doses were not charted for 9/16 and 9/17, RN not sure if they were given. No new cbc, No bleeding noted per chart.  Home dose of 4 mg daily except 6 mg on Wednesday  Goal of Therapy:  INR 2-3 Monitor platelets by anticoagulation protocol: Yes   Plan:  1) Coumadin 6 mg po x 1 tonight 2) Daily INR  Bayard Hugger, PharmD, BCPS  Clinical Pharmacist  Pager: [phone]   04/18/2014,12:20 PM

## 2014-04-18 NOTE — Clinical Social Work Note (Signed)
CSW continues to follow this patient for discharge planning needs. CSW met with patient's HCPOA and daughter Clarice Pole) and provided bed offers: [facility], [facility], [facility] ([place], [place]), [place], [facility], and [facility] and [facility]. Patient daughter stated she would like to review bed offer and provide CSW with decision on 04/19/14. CSW explained to daughter importance of making decision as to not incurr any out of pocket hospital costs if patient medically ready for d/c and facility has not been chosen. Patient's daughter verbalized understanding of the above. CSW to follow tomorrow with bed decision from daughter.  [place], [place] Weekend Clinical Social Worker [phone]

## 2014-04-18 NOTE — Progress Notes (Signed)
[facility] PROGRESS NOTE  Dale Whitehead. ZOX:[id number] DOB: November 20, 1923 DOA: 04/14/2014 PCP: [name], MD  Assessment/Plan: 1. R humeral fracture  1. Pt had been seen by Orthopedic surgery 2. Cont immobilizer per Ortho recs with no therapy to the shoulder for 4-5 weeks, then gentle progressive therapy 3. If shoulder does not heal with persistent pain, reverse total shoulder replacement recommended 2. R scaphoid fracture  1. Orthopedic surgery following 2. Radiology recs for follow up wrist xray in 2 weeks from admission to assess for additional fx 3. Wrist brace continued 3. HTN  1. Had been stable 4. CAD  1. Stable 5. DVT  1. Continue Coumadin per Pharmacy 6. Acute on CKD  1. Baseline Cr of around 2 2. Had risen to 2.33, improved with gentle IVF 3. Hold fluids today  Code Status: Full Family Communication: Pt in room, family at bedside Disposition Plan: Awaiting SNF placement   Consultants:  Orthopedic surgery through ED  Procedures:    Antibiotics:   (indicate start date, and stop date if known)  HPI/Subjective: Asleep, no acute events noted overnight  Objective: Filed Vitals:   04/17/14 0900 04/17/14 1529 04/17/14 2147 04/18/14 0615  BP: 139/48 175/98 117/51 121/71  Pulse: 93 93 89 96  Temp:  98.6 F (37 C) 98.6 F (37 C) 99.7 F (37.6 C)  TempSrc:  Oral Oral Axillary  Resp:  Height:      Weight:    102 kg (224 lb 13.9 oz)  SpO2: 94% 95% 96% 95%    Intake/Output Summary (Last 24 hours) at 04/18/14 1138 Last data filed at 04/18/14 8119  Gross per 24 hour  Intake    540 ml  Output   1300 ml  Net   -760 ml   Filed Weights   04/16/14 0456 04/17/14 0617 04/18/14 0615  Weight: 100.7 kg (222 lb 0.1 oz) 99.85 kg (220 lb 2.1 oz) 102 kg (224 lb 13.9 oz)    Exam:   General:  Awake, in nad  Cardiovascular: regular, s1, s2  Respiratory: normal resp effort, no wheezing  Abdomen: soft,nondistended  Musculoskeletal:  perfused, no clubbing   Data Reviewed: Basic Metabolic Panel:  Recent Labs Lab 04/15/14 0047 04/16/14 0420 04/17/14 0540  NA 136* 134* 134*  K 3.4* 3.9 3.4*  CL 92* 90* 94*  CO2 GLUCOSE 183* 174* 117*  BUN 55* 59* 50*  CREATININE 1.93* 2.33* 2.00*  CALCIUM 8.9 8.9 8.3*   Liver Function Tests:  Recent Labs Lab 04/16/14 0420  AST 37  ALT 23  ALKPHOS 89  BILITOT 0.3  PROT 7.0  ALBUMIN 2.9*   No results found for this basename: LIPASE, AMYLASE,  in the last 168 hours No results found for this basename: AMMONIA,  in the last 168 hours CBC:  Recent Labs Lab 04/15/14 0047 04/16/14 0420  WBC 12.2* 12.4*  NEUTROABS 9.8*  --   HGB 12.6* 11.7*  HCT 36.4* 34.3*  MCV 91.9 93.5  PLT 186 215   Cardiac Enzymes:  Recent Labs Lab 04/15/14 0047  TROPONINI <0.30   BNP (last 3 results) No results found for this basename: PROBNP,  in the last 8760 hours CBG: No results found for this basename: GLUCAP,  in the last 168 hours  No results found for this or any previous visit (from the past 240 hour(s)).   Studies: No results found.  Scheduled Meds: . docusate sodium  300 mg Oral QHS  .  losartan  50 mg Oral Daily  . metolazone  2.5 mg Oral QODAY  . mirtazapine  15 mg Oral QHS  . neomycin-bacitracin-polymyxin   Topical Daily  . polyethylene glycol  17 g Oral Daily  . sodium chloride  3 mL Intravenous Q12H  . torsemide  20 mg Oral Daily  . Warfarin - Pharmacist Dosing Inpatient   Does not apply q1800   Continuous Infusions:    Principal Problem:   Right humeral fracture Active Problems:   HTN (hypertension)   CAD (coronary artery disease) status post CABG 1985   History of DVT (deep vein thrombosis), recurrent   CKD (chronic kidney disease) stage 3, GFR 30-59 ml/min   Fx humeral neck  Time spent:  CHIU, STEPHEN K  [facility] Pager [phone]. If 7PM-7AM, please contact night-coverage at [email or web address], password [facility] 04/18/2014, 11:38  AM  LOS: 4 days

## 2014-04-18 NOTE — Progress Notes (Signed)
Occupational Therapy Evaluation Patient Details Name: Dale Whitehead. MRN: [id number] DOB: [birth year] Today's Date: 04/18/2014    History of Present Illness With a hx of recurrent DVT on coumadin, HTN, CAD s/p CABG, and hx of chronic R heart failure who presented to the ED with a mechanical fall. Pt was noted to have a R humeral neck fx and R scaphoid fx.    Clinical Impression   PTA pt required assistance for ADLs and functional transfers. Pt currently requires total assist for ADLs due to RUE immobilization, pain, and lethargy. Pt family plans d/c to SNF. Pt would benefit from acute OT for HEP for UE.     Follow Up Recommendations  SNF;Supervision/Assistance - 24 hour    Equipment Recommendations  Other (comment) (defer to SNF)    Recommendations for Other Services       Precautions / Restrictions Precautions Precautions: Fall Precaution Comments: multiple falls in past year Required Braces or Orthoses: Sling;Other Brace/Splint Other Brace/Splint: R wrist splint Restrictions Weight Bearing Restrictions: Yes RUE Weight Bearing: Non weight bearing      Mobility Bed Mobility               General bed mobility comments: Not addressed at this time.   Transfers                 General transfer comment: Not addressed due to lethargy.          ADL Overall ADL's : Needs assistance/impaired                                       General ADL Comments: Pt currently requires Total (A) for ADLs due to RUE pain and limitations and baseline status. Pt is currently lethargic and daughter reports that he has been "somewhat different" cognitively, possibly due to medications. pt is for SNF, however would benefit from PROM for RUE as ordered.         Perception Perception Perception Tested?: No   Praxis Praxis Praxis tested?: Not tested    Pertinent Vitals/Pain Pain Assessment: Faces Pain Score: 6  Faces Pain Scale: Hurts even more Pain  Location: RUE Pain Intervention(s): Limited activity within patient's tolerance;Monitored during session;Premedicated before session;Repositioned     Hand Dominance Right   Extremity/Trunk Assessment Upper Extremity Assessment Upper Extremity Assessment: RUE deficits/detail RUE Deficits / Details: No shoulder ROM; elbow, wrist, hand OK per orders; edema noted RUE: Unable to fully assess due to pain;Unable to fully assess due to immobilization LUE Deficits / Details: edema noted L hand, +3/5 grip, limited shoulder elevatoin PROM to 60* (h/o TSA per family)   Lower Extremity Assessment Lower Extremity Assessment: Generalized weakness       Communication Communication Communication: No difficulties   Cognition Arousal/Alertness: Lethargic;Suspect due to medications Behavior During Therapy: Flat affect Overall Cognitive Status: Difficult to assess                        Exercises Exercises: General Upper Extremity     04/18/14 1800  General Exercises - Upper Extremity  Elbow Flexion PROM;Right;5 reps;Supine  Elbow Extension PROM;Right;5 reps;Supine  Wrist Flexion PROM;Right;5 reps;Supine  Wrist Extension PROM;Right;5 reps;Supine  Digit Composite Flexion PROM;Right;5 reps;Supine  Composite Extension PROM;Right;5 reps;Supine           Home Living Family/patient expects to be discharged to:: Skilled nursing facility Living Arrangements:  Children                                      Prior Functioning/Environment Level of Independence: Needs assistance  Gait / Transfers Assistance Needed: uses lift chair to stand with rollator, has not walked in a year, was able to transfer to [facility] with assist ADL's / Homemaking Assistance Needed: daughter helps to dress and sponge bathe; pt can assist with UB        OT Diagnosis: Generalized weakness;Acute pain   OT Problem List: Decreased strength;Decreased range of motion;Decreased activity tolerance;Decreased  knowledge of precautions;Impaired UE functional use;Pain   OT Treatment/Interventions: Self-care/ADL training;Therapeutic exercise;Therapeutic activities;Patient/family education    OT Goals(Current goals can be found in the care plan section) Acute Rehab OT Goals Patient Stated Goal: to keep his hand functional OT Goal Formulation: With patient/family Time For Goal Achievement: 05/02/14 Potential to Achieve Goals: Good ADL Goals Pt Will Perform Grooming: with set-up;with supervision;sitting Pt/caregiver will Perform Home Exercise Program: Increased ROM;Right Upper extremity;With Supervision  OT Frequency: Min 2X/week   Barriers to D/C: Other (comment)  Pt requires higher level of care          End of Session Equipment Utilized During Treatment: Other (comment) (sling and Rt wrist thumb spica splint)  Activity Tolerance: Patient limited by lethargy;Patient limited by pain Patient left: in bed;with call bell/phone within reach;with family/visitor present   Time: 1740-1830 OT Time Calculation (min): 50 min Charges:  OT General Charges $OT Visit: 1 Procedure OT Evaluation $Initial OT Evaluation Tier I: 1 Procedure OT Treatments $Therapeutic Activity: 8-22 mins $Therapeutic Exercise: 8-22 mins  Rae Lips  [phone]  04/18/2014, 6:54 PM

## 2014-04-19 ENCOUNTER — Inpatient Hospital Stay (HOSPITAL_COMMUNITY): Payer: Medicare Other

## 2014-04-19 LAB — BASIC METABOLIC PANEL
ANION GAP: 17 — AB (ref 5–15)
BUN: 48 mg/dL — ABNORMAL HIGH (ref 6–23)
CALCIUM: 7.7 mg/dL — AB (ref 8.4–10.5)
CO2: 25 mEq/L (ref 19–32)
Chloride: 93 mEq/L — ABNORMAL LOW (ref 96–112)
Creatinine, Ser: 1.87 mg/dL — ABNORMAL HIGH (ref 0.50–1.35)
GFR calc Af Amer: 35 mL/min — ABNORMAL LOW (ref >90)
GFR, EST NON AFRICAN AMERICAN: 30 mL/min — AB (ref >90)
Glucose, Bld: 108 mg/dL — ABNORMAL HIGH (ref 70–99)
Potassium: 3.7 mEq/L (ref 3.7–5.3)
SODIUM: 135 meq/L — AB (ref 137–147)

## 2014-04-19 LAB — PROTIME-INR
INR: 2.75 — AB (ref 0.00–1.49)
Prothrombin Time: 29.1 seconds — ABNORMAL HIGH (ref 11.6–15.2)

## 2014-04-19 MED ORDER — PEG-KCL-NACL-NASULF-NA ASC-C 100 G PO SOLR
1.0000 | Freq: Once | ORAL | Status: AC
  Administered 2014-04-19: 200 g via ORAL
  Filled 2014-04-19 (×2): qty 1

## 2014-04-19 MED ORDER — CETYLPYRIDINIUM CHLORIDE 0.05 % MT LIQD
7.0000 mL | Freq: Two times a day (BID) | OROMUCOSAL | Status: DC
  Administered 2014-04-19 – 2014-04-20 (×3): 7 mL via OROMUCOSAL

## 2014-04-19 MED ORDER — WARFARIN SODIUM 1 MG PO TABS
1.0000 mg | ORAL_TABLET | Freq: Once | ORAL | Status: AC
  Administered 2014-04-19: 1 mg via ORAL
  Filled 2014-04-19: qty 1

## 2014-04-19 MED ORDER — BISACODYL 10 MG RE SUPP
10.0000 mg | Freq: Once | RECTAL | Status: AC
  Administered 2014-04-19: 10 mg via RECTAL
  Filled 2014-04-19: qty 1

## 2014-04-19 NOTE — Progress Notes (Signed)
The patient has become increasingly confused since being hospitalized.  The family member at the bedside is concerned.  Dale Whitehead was notified and education was given to the family member about older adults and confusion in the hospital.   The patient was also lethargic, but arousable at the beginning of the shift.  One percocet has been given instead of two for pain control to help with the lethargy and confusion.  The patient's O2 level was in the lower 90s on RA and he had a little dyspnea at rest.  2L of O2 were placed on the patient overnight and an incentive spirometer was ordered for the patient.

## 2014-04-19 NOTE — Clinical Social Work Note (Signed)
CSW continues to follow this patient for discharge planning needs. CSW spoke with patient's RN Tacey Ruiz) who reported patient has not had a BM, although results for CT scan are negative. CSW contacted [employer] and made Milik Gilreath aware. Per Rease Wence, if patient is passing gas, then facility can still receive patient. Per Tacey Ruiz, it is unknown if patient passing gas, but patient continues to experience difficulty with having a BM. CSW to continue to follow tomorrow for d/c.  Ceciley Buist Patrick-[place]erson, LCSWA Weekend Clinical Social Worker [phone]

## 2014-04-19 NOTE — Progress Notes (Signed)
[facility] PROGRESS NOTE  Dale Whitehead. ZOX:[id number] DOB: May 21, 1924 DOA: 04/14/2014 PCP: [name], MD  Assessment/Plan: 1. R humeral fracture  1. Pt had been seen by Orthopedic surgery 2. Cont immobilizer per Ortho recs with no therapy to the shoulder for 4-5 weeks, then gentle progressive therapy 3. If shoulder does not heal with persistent pain, reverse total shoulder replacement recommended 2. R scaphoid fracture  1. Orthopedic surgery following 2. Radiology recs for follow up wrist xray in 2 weeks from admission to assess for additional fx 3. Wrist brace continued 3. HTN  1. Had been stable 4. CAD  1. Stable 5. DVT  1. Continue Coumadin per Pharmacy 6. Acute on CKD  1. Baseline Cr of around 2 2. Had risen to 2.33, improved with gentle IVF 3. Hold fluids today 7. Abd distension 1. Decreased PO intake associated with decreased BM and increased abd distension related to pain med use 2. Cathartics ordered  Code Status: Full Family Communication: Pt in room, family at bedside Disposition Plan: Awaiting SNF placement   Consultants:  Orthopedic surgery through ED  Procedures:    Antibiotics:   (indicate start date, and stop date if known)  HPI/Subjective: Complains of abd fullness and bloating. No recent BM noted.  Objective: Filed Vitals:   04/18/14 2042 04/19/14 0410 04/19/14 1046 04/19/14 1420  BP: 142/66 135/76  132/90  Pulse: 97 97 90 98  Temp: 97.9 F (36.6 C) 98.7 F (37.1 C) 98.8 F (37.1 C) 97.9 F (36.6 C)  TempSrc: Oral Oral Oral Oral  Resp: Height:      Weight:  99.9 kg (220 lb 3.8 oz)    SpO2: 94% 97% 96% 97%    Intake/Output Summary (Last 24 hours) at 04/19/14 1636 Last data filed at 04/19/14 1341  Gross per 24 hour  Intake    243 ml  Output   3000 ml  Net  -2757 ml   Filed Weights   04/17/14 0617 04/18/14 0615 04/19/14 0410  Weight: 99.85 kg (220 lb 2.1 oz) 102 kg (224 lb 13.9 oz) 99.9 kg (220 lb  3.8 oz)    Exam:   General:  Awake, in nad  Cardiovascular: regular, s1, s2  Respiratory: normal resp effort, no wheezing  Abdomen: mildly distended and tympanic, decreased BS  Musculoskeletal: perfused, no clubbing   Data Reviewed: Basic Metabolic Panel:  Recent Labs Lab 04/15/14 0047 04/16/14 0420 04/17/14 0540 04/18/14 1044 04/19/14 0535  NA 136* 134* 134* 134* 135*  K 3.4* 3.9 3.4* 4.6 3.7  CL 92* 90* 94* 94* 93*  CO2 GLUCOSE 183* 174* 117* 132* 108*  BUN 55* 59* 50* 48* 48*  CREATININE 1.93* 2.33* 2.00* 1.87* 1.87*  CALCIUM 8.9 8.9 8.3* 8.0* 7.7*   Liver Function Tests:  Recent Labs Lab 04/16/14 0420  AST 37  ALT 23  ALKPHOS 89  BILITOT 0.3  PROT 7.0  ALBUMIN 2.9*   No results found for this basename: LIPASE, AMYLASE,  in the last 168 hours No results found for this basename: AMMONIA,  in the last 168 hours CBC:  Recent Labs Lab 04/15/14 0047 04/16/14 0420  WBC 12.2* 12.4*  NEUTROABS 9.8*  --   HGB 12.6* 11.7*  HCT 36.4* 34.3*  MCV 91.9 93.5  PLT 186 215   Cardiac Enzymes:  Recent Labs Lab 04/15/14 0047  TROPONINI <0.30   BNP (last 3 results) No results found for this basename:  PROBNP,  in the last 8760 hours CBG:  Recent Labs Lab 04/18/14 2154  GLUCAP 117*    No results found for this or any previous visit (from the past 240 hour(s)).   Studies: Ct Head Wo Contrast  04/19/2014   CLINICAL DATA:  Recent head injury with mental status change. The patient was confused and uncooperative on the CT table ; history of hypertension, coronary artery disease, and chronic renal insufficiency  EXAM: CT HEAD WITHOUT CONTRAST  TECHNIQUE: Contiguous axial images were obtained from the base of the skull through the vertex without intravenous contrast.  COMPARISON:  Noncontrast CT scan of the brain of April 15, 2014  FINDINGS: There is moderate diffuse cerebral and cerebellar atrophy with compensatory ventriculomegaly. There  is no acute intracranial hemorrhage. No acute ischemic changes are demonstrated. Decreased density in the deep white matter of both cerebral hemispheres are consistent with chronic small vessel ischemic changes. The cerebellum and brainstem exhibit no acute abnormalities.  The observed paranasal sinuses and mastoid air cells are clear. There is no acute skull fracture.  IMPRESSION: Stable appearance of the brain since a study of 4 days ago. There is no acute ischemic or hemorrhagic event.   Electronically Signed   By: David  [place]   On: 04/19/2014 12:49    Scheduled Meds: . antiseptic oral rinse  7 mL Mouth Rinse BID  . bisacodyl  10 mg Rectal Once  . docusate sodium  300 mg Oral QHS  . losartan  50 mg Oral Daily  . metolazone  2.5 mg Oral QODAY  . mirtazapine  15 mg Oral QHS  . neomycin-bacitracin-polymyxin   Topical Daily  . polyethylene glycol  17 g Oral Daily  . sodium chloride  3 mL Intravenous Q12H  . torsemide  20 mg Oral Daily  . warfarin  1 mg Oral ONCE-1800  . Warfarin - Pharmacist Dosing Inpatient   Does not apply q1800   Continuous Infusions:    Principal Problem:   Right humeral fracture Active Problems:   HTN (hypertension)   CAD (coronary artery disease) status post CABG 1985   History of DVT (deep vein thrombosis), recurrent   CKD (chronic kidney disease) stage 3, GFR 30-59 ml/min   Fx humeral neck  Time spent:  Izabella Marcantel K  [facility] Pager [phone]. If 7PM-7AM, please contact night-coverage at [email or web address], password [facility] 04/19/2014, 4:36 PM  LOS: 5 days

## 2014-04-19 NOTE — Progress Notes (Signed)
ANTICOAGULATION CONSULT NOTE   Pharmacy Consult for Coumadin Indication: DVT history  Allergies  Allergen Reactions  . Accupril [Quinapril Hcl] Cough  . Erythromycin Other (See Comments)    Very bad burning  . Niacin     REACTION: itching  . Paxil [Paroxetine Hcl] Other (See Comments)    unknown  . Statins Other (See Comments)    tremors   . Tamsulosin Hcl Other (See Comments)    "did nothing"  . Tramadol-Acetaminophen Rash    Patient Measurements: Height:  (165.1 cm) Weight: 220 lb 3.8 oz (99.9 kg) IBW/kg (Calculated) : 61.5  Vital Signs: Temp: 98.8 F (37.1 C) (09/20 1046) Temp src: Oral (09/20 1046) BP: 135/76 mmHg (09/20 0410) Pulse Rate: 90 (09/20 1046)  Labs:  Recent Labs  04/17/14 0540 04/18/14 0550 04/18/14 1044 04/19/14 0535  LABPROT 29.7* 20.4*  --  29.1*  INR 2.82* 1.75*  --  2.75*  CREATININE 2.00*  --  1.87* 1.87*    Estimated Creatinine Clearance: 29.7 ml/min (by C-G formula based on Cr of 1.87).   Assessment: 80 year old male on Coumadin PTA for DVT admitted after a fall and unable to ambulate History of HTN, CAD s/p CABG, and CHF, INR therapeutic 1.75 > 2.75 today, but coumadin doses were not charted for 9/16 and 9/17, RN not sure if they were given. Poor PO intake, No new cbc, No bleeding noted per chart.  Home dose of 4 mg daily except 6 mg on Wednesday  Goal of Therapy:  INR 2-3 Monitor platelets by anticoagulation protocol: Yes   Plan:  1) Coumadin 1 mg po x 1 tonight 2) Daily INR  Bayard Hugger, PharmD, BCPS  Clinical Pharmacist  Pager: [phone]   04/19/2014,12:52 PM

## 2014-04-19 NOTE — Progress Notes (Signed)
pt confused and uncooperative in CT- fighting to get off table- swung left leg of side of table, and dislodged his condom cath

## 2014-04-19 NOTE — Clinical Social Work Note (Signed)
CSW made aware patient ready for d/c. CSW met with patient's daughter who reports family has accepted bed offer with [facility]. CSW contacted facility and spoke with Arion Shankles to confirm bed availability. CSW prepared d/c packet and placed in patient's shadow chart. CSW to provide RN with number for room and report. CSW to arrange transportation via [place].  CSW made aware of pending CT scan results for discharge. CSW to follow with d/c to [facility].   [place], [place] Weekend Clinical Social Worker [phone]

## 2014-04-20 ENCOUNTER — Inpatient Hospital Stay (HOSPITAL_COMMUNITY): Payer: Medicare Other

## 2014-04-20 LAB — BASIC METABOLIC PANEL
Anion gap: 16 — ABNORMAL HIGH (ref 5–15)
BUN: 55 mg/dL — ABNORMAL HIGH (ref 6–23)
CALCIUM: 7.6 mg/dL — AB (ref 8.4–10.5)
CO2: 26 mEq/L (ref 19–32)
Chloride: 96 mEq/L (ref 96–112)
Creatinine, Ser: 1.99 mg/dL — ABNORMAL HIGH (ref 0.50–1.35)
GFR calc Af Amer: 33 mL/min — ABNORMAL LOW (ref >90)
GFR calc non Af Amer: 28 mL/min — ABNORMAL LOW (ref >90)
GLUCOSE: 126 mg/dL — AB (ref 70–99)
POTASSIUM: 3.4 meq/L — AB (ref 3.7–5.3)
Sodium: 138 mEq/L (ref 137–147)

## 2014-04-20 LAB — PROTIME-INR
INR: 2.8 — ABNORMAL HIGH (ref 0.00–1.49)
Prothrombin Time: 29.5 seconds — ABNORMAL HIGH (ref 11.6–15.2)

## 2014-04-20 MED ORDER — WARFARIN SODIUM 2.5 MG PO TABS
2.5000 mg | ORAL_TABLET | Freq: Once | ORAL | Status: AC
  Administered 2014-04-20: 2.5 mg via ORAL
  Filled 2014-04-20: qty 1

## 2014-04-20 MED ORDER — POTASSIUM CHLORIDE CRYS ER 20 MEQ PO TBCR
40.0000 meq | EXTENDED_RELEASE_TABLET | Freq: Once | ORAL | Status: AC
  Administered 2014-04-20: 40 meq via ORAL
  Filled 2014-04-20: qty 2

## 2014-04-20 MED ORDER — OXYCODONE-ACETAMINOPHEN 5-325 MG PO TABS: 1.0000 | ORAL_TABLET | ORAL | Status: AC | PRN

## 2014-04-20 MED ORDER — POLYETHYLENE GLYCOL 3350 17 G PO PACK: 17.0000 g | PACK | Freq: Every day | ORAL | Status: AC

## 2014-04-20 NOTE — Progress Notes (Signed)
SATURATION QUALIFICATIONS: (This note is used to comply with regulatory documentation for home oxygen)  Patient Saturations on Room Air at Rest = 88%  Patient Saturations on 2 Liters of oxygen while resting = 95%

## 2014-04-20 NOTE — Progress Notes (Signed)
Occupational Therapy Treatment Patient Details Name: Dale Whitehead. MRN: [id number] DOB: 03/24/1924 Today's Date: 04/20/2014    History of present illness With a hx of recurrent DVT on coumadin, HTN, CAD s/p CABG, and hx of chronic R heart failure who presented to the ED with a mechanical fall. Pt was noted to have a R humeral neck fx and R scaphoid fx.    OT comments  Focus of session on R elbow to finger PROM within pt's pain tolerance and sitting EOB with assist of PT. Pt fatigues easily with poor sitting balance and L side lean which is baseline per very attentive daughter who was bedside throughout session.  Follow Up Recommendations  SNF;Supervision/Assistance - 24 hour    Equipment Recommendations       Recommendations for Other Services      Precautions / Restrictions Precautions Precautions: Fall Precaution Comments: multiple falls in past year, fx R shoulder Required Braces or Orthoses: Sling;Other Brace/Splint Other Brace/Splint: R wrist splint Restrictions Weight Bearing Restrictions: Yes RUE Weight Bearing: Non weight bearing Other Position/Activity Restrictions: NWB RUE       Mobility Bed Mobility Overal bed mobility: +2 for physical assistance;Needs Assistance Bed Mobility: Supine to Sit;Sit to Supine     Supine to sit: +2 for physical assistance;+2 for safety/equipment;Total assist Sit to supine: +2 for physical assistance;Total assist      Transfers                      Balance Overall balance assessment: Needs assistance Sitting-balance support: Feet supported;Single extremity supported Sitting balance-Leahy Scale: Poor Sitting balance - Comments: pt sat on EOB x 10 minutes, he tended to lean L, required assist to come to neutral then able to maintain neutral Postural control: Left lateral lean                         ADL                                                Vision                     Perception     Praxis      Cognition   Behavior During Therapy: Flat affect Overall Cognitive Status: History of cognitive impairments - at baseline                       Extremity/Trunk Assessment               Exercises General Exercises - Upper Extremity Elbow Flexion: AROM;Right;10 reps;Supine Elbow Extension: PROM;Right;10 reps;Supine Wrist Flexion: PROM;Right;10 reps;Supine Wrist Extension: PROM;Right;10 reps;Supine Digit Composite Flexion: PROM;Right;5 reps;Supine Composite Extension: PROM;Right;5 reps;Supine   Shoulder Instructions       General Comments      Pertinent Vitals/ Pain       Pain Assessment: Faces Pain Score: 5  Faces Pain Scale: Hurts little more Pain Location: R UE Pain Intervention(s): Premedicated before session;Repositioned;Patient requesting pain meds-RN notified  Home Living                                          Prior Functioning/Environment  Frequency Min 2X/week     Progress Toward Goals  OT Goals(current goals can now be found in the care plan section)  Progress towards OT goals: Progressing toward goals  Acute Rehab OT Goals Patient Stated Goal: to keep his hand functional  Plan Discharge plan remains appropriate    Co-evaluation    PT/OT/SLP Co-Evaluation/Treatment: Yes (for bed mobility and sitting EOB) Reason for Co-Treatment: For patient/therapist safety;Complexity of the patient's impairments (multi-system involvement)   OT goals addressed during session: Strengthening/ROM      End of Session Equipment Utilized During Treatment: Other (comment)   Activity Tolerance Patient limited by fatigue;Patient limited by pain   Patient Left in bed;with call bell/phone within reach;with family/visitor present   Nurse Communication Patient requests pain meds (condom cath came off)        Time: 1000-1044 OT Time Calculation (min): 44 min  Charges: OT General  Charges $OT Visit: 1 Procedure OT Treatments $Therapeutic Activity: 8-22 mins $Therapeutic Exercise: 8-22 mins  Evern Bio 04/20/2014, 10:54 AM [phone]

## 2014-04-20 NOTE — Progress Notes (Signed)
Physical Therapy Treatment Patient Details Name: Dale Whitehead. MRN: [id number] DOB: 04-27-1924 Today's Date: 04/20/2014    History of Present Illness With a hx of recurrent DVT on coumadin, HTN, CAD s/p CABG, and hx of chronic R heart failure who presented to the ED with a mechanical fall. Pt was noted to have a R humeral neck fx and R scaphoid fx.     PT Comments    Pt with wheezing noted during treatment with o2 sats in 90's on 4 L/min.  Sat EOB with TOT A of 2 to achieve position with L lateral lean in sitting.  Con't to recommend SNF for further rehab.  Follow Up Recommendations  SNF;Supervision/Assistance - 24 hour     Equipment Recommendations  None recommended by PT    Recommendations for Other Services       Precautions / Restrictions Precautions Precautions: Fall Precaution Comments: multiple falls in past year, fx R shoulder Required Braces or Orthoses: Sling;Other Brace/Splint Other Brace/Splint: R wrist splint Restrictions Weight Bearing Restrictions: Yes RUE Weight Bearing: Non weight bearing Other Position/Activity Restrictions: NWB RUE    Mobility  Bed Mobility Overal bed mobility: +2 for physical assistance;Needs Assistance Bed Mobility: Supine to Sit;Sit to Supine     Supine to sit: Total assist;+2 for physical assistance Sit to supine: Total assist;+2 for physical assistance   General bed mobility comments: Use of pads to A in getting pt to EOB.  Pt encouraged to use L UE to A, but did not attempt to help.  Transfers                 General transfer comment: sat EOB only.  Ambulation/Gait                 Stairs            Wheelchair Mobility    Modified Rankin (Stroke Patients Only)       Balance Overall balance assessment: Needs assistance Sitting-balance support: Feet supported;Single extremity supported Sitting balance-Leahy Scale: Poor Sitting balance - Comments: Pt sat EOb for 10 minutes with tendency to  lean L.  Cueing to get to neutral position and able to maintain briefly before leaning left.  Worked on pursed lip breathing at EOB with 02  in 90's on 4 L/min.  Pt c/o some dizziness, but with little verbalizations when pressed on how he was feeling. Postural control: Left lateral lean                          Cognition Arousal/Alertness: Awake/alert Behavior During Therapy: Flat affect Overall Cognitive Status: History of cognitive impairments - at baseline                      Exercises General Exercises - Upper Extremity Elbow Flexion: AROM;Right;10 reps;Supine Elbow Extension: PROM;Right;10 reps;Supine Wrist Flexion: PROM;Right;10 reps;Supine Wrist Extension: PROM;Right;10 reps;Supine Digit Composite Flexion: PROM;Right;5 reps;Supine Composite Extension: PROM;Right;5 reps;Supine    General Comments General comments (skin integrity, edema, etc.): daughter present      Pertinent Vitals/Pain Pain Assessment: Faces Pain Score: 5  Faces Pain Scale: Hurts little more Pain Location: R UE Pain Intervention(s): Patient requesting pain meds-RN notified;Repositioned    Home Living                      Prior Function            PT Goals (current goals can  now be found in the care plan section) Acute Rehab PT Goals Patient Stated Goal: to keep his hand functional PT Goal Formulation: With patient Time For Goal Achievement: 04/30/14 Potential to Achieve Goals: Fair Progress towards PT goals: Progressing toward goals    Frequency  Min 3X/week    PT Plan Discharge plan needs to be updated    Co-evaluation PT/OT/SLP Co-Evaluation/Treatment: Yes Reason for Co-Treatment: Complexity of the patient's impairments (multi-system involvement);For patient/therapist safety PT goals addressed during session: Mobility/safety with mobility;Balance OT goals addressed during session: Strengthening/ROM     End of Session Equipment Utilized During Treatment:  Oxygen Activity Tolerance: Patient limited by fatigue Patient left: in bed;with call bell/phone within reach;with bed alarm set;with family/visitor present     Time: 1610-9604 PT Time Calculation (min): 26 min  Charges:  $Therapeutic Activity: 8-22 mins                    G Codes:      Dale Whitehead 04/20/2014, 11:18 AM

## 2014-04-20 NOTE — Progress Notes (Signed)
The patient had a loose bowel movement this morning.  He did complain of SOB once after receiving a bath, but it subsided after turning his O2 up to 3L and repositioning him in the bed.  He received PRN Percocet with relief x2 overnight.  He is still having some confusion and is very weak.

## 2014-04-20 NOTE — Clinical Social Work Placement (Signed)
     Clinical Social Work Department CLINICAL SOCIAL WORK PLACEMENT NOTE 04/20/2014  Patient:  Dale Whitehead, Dale Whitehead  Account Number:  [id number] Admit date:  04/14/2014  Clinical Social Worker:  Lupita Leash Chai Verdejo, LCSW  Date/time:  04/17/2014 02:41 PM  Clinical Social Work is seeking post-discharge placement for this patient at the following level of care:   SKILLED NURSING   (*CSW will update this form in Epic as items are completed)   04/17/2014  Patient/family provided with [facility] Department of Clinical Social Works list of facilities offering this level of care within the geographic area requested by the patient (or if unable, by the patients family).  04/17/2014  Patient/family informed of their freedom to choose among providers that offer the needed level of care, that participate in Medicare, Medicaid or managed care program needed by the patient, have an available bed and are willing to accept the patient.  04/17/2014  Patient/family informed of [facility] ownership interest in [facility], as well as of the fact that they are under no obligation to receive care at this facility.  PASARR submitted to EDS on 04/17/2014 PASARR number received on 04/17/2014  FL2 transmitted to all facilities in geographic area requested by pt/family on  04/17/2014 FL2 transmitted to all facilities within larger geographic area on 04/17/2014  Patient informed that his/her managed care company has contracts with or will negotiate with  certain facilities, including the following:   n/a     Patient/family informed of bed offers received:  04/20/2014 Patient chooses bed at [facility] Physician recommends and patient chooses bed at    [facility] to be [facility] on  04/20/2014 Patient to be transferred to facility by Ambulance  [facility]) Patient and family notified of transfer on 04/20/2014 Name of family member notified:  Almyra Deforest- Daughter  The following  physician request were entered in Epic: Physician Request  Please prepare priority discharge summary and prescriptions.  Please sign FL2.    Additional Comments: 04/20/14  OK per MD for d/c today. Original plan was for d/c to [facility] but they withdrew their bed offer and also stated that they did not have any vacancies today. CSW offered additional bed offers to daughter who went to tour [facility] and accepted a bed there. Nursing notified and called report. Patient was agreeable to d/c as he wants rehab.  Daughter was agreeable to bed offer.Message left for Roger Shelter, SW- [facility] re: above information. Per earlier discussion with her- Hospice planned to revoke Hospice services while under rehab Medicare at [facility] T. Marcel Gary, LCSW  [phone]

## 2014-04-20 NOTE — Progress Notes (Signed)
PHARMACY NOTE  Pharmacy Consult :  80 y.o. male is currently on chronic Coumadin for Hx DVT.   Dosing Wt :  99 kg  Hematology :  Recent Labs  04/18/14 0550 04/18/14 1044 04/19/14 0535 04/20/14 0616  LABPROT 20.4*  --  29.1* 29.5*  INR 1.75*  --  2.75* 2.80*  CREATININE  --  1.87* 1.87* 1.99*    Current Medication[s] Include: Medication PTA: Prescriptions prior to admission  Medication Sig Dispense Refill  . acetaminophen (TYLENOL) 500 MG tablet Take 500-1,000 mg by mouth every 6 (six) hours as needed for mild pain.      . clotrimazole (LOTRIMIN) 1 % cream Apply 1 application topically daily as needed (yeast).       Marland Kitchen docusate sodium (COLACE) 100 MG capsule Take 300 mg by mouth at bedtime.      . famotidine (PEPCID) 10 MG tablet Take 10 mg by mouth daily as needed for heartburn.       . losartan (COZAAR) 50 MG tablet TAKE 1 TABLET BY MOUTH DAILY  30 tablet  9  . metolazone (ZAROXOLYN) 2.5 MG tablet Take 2.5 mg by mouth every other day.      . mirtazapine (REMERON) 15 MG tablet Take 15 mg by mouth at bedtime.      . Multiple Vitamin (MULTIVITAMIN) tablet Take 1 tablet by mouth daily.      . mupirocin ointment (BACTROBAN) 2 % Place 1 application into the nose daily.       . potassium chloride (K-DUR,KLOR-CON) 10 MEQ tablet Take 3 tablets (30 mEq total) by mouth daily.  90 tablet  11  . silodosin (RAPAFLO) 8 MG CAPS capsule Take 8 mg by mouth daily with breakfast.      . torsemide (DEMADEX) 20 MG tablet Take 20 mg by mouth daily.       . Vitamin D, Ergocalciferol, (DRISDOL) 50000 UNITS CAPS capsule Take 50,000 Units by mouth every 7 (seven) days.      Marland Kitchen warfarin (COUMADIN) 4 MG tablet Take 4-6 mg by mouth every evening. Take  everyday except Wednesday take .       Scheduled:  Scheduled:  . antiseptic oral rinse  7 mL Mouth Rinse BID  . docusate sodium  300 mg Oral QHS  . losartan  50 mg Oral Daily  . metolazone  2.5 mg Oral QODAY  .  mirtazapine  15 mg Oral QHS  . neomycin-bacitracin-polymyxin   Topical Daily  . polyethylene glycol  17 g Oral Daily  . sodium chloride  3 mL Intravenous Q12H  . torsemide  20 mg Oral Daily  . Warfarin - Pharmacist Dosing Inpatient   Does not apply q1800   Infusion[s]: Infusions:   Antibiotic[s]: Anti-infectives   None     Assessment :  Today's INR 2.8.   INR still within therapeutic range following reduction in Coumadin doses.    No evidence of bleeding complications observed.  Possible Transfer to SNF today noted.  Goal :  INR goal is 2-3  Plan : 1. Coumadin 2.5 mg today.  May give prior to transfer to SNF today, if needed. 2. Daily INR's, CBC. Monitor for bleeding complications.   Follow Platelet counts.   Dale Whitehead, [name],  Pharm.D  04/20/2014  10:24 AM

## 2014-04-20 NOTE — Discharge Summary (Addendum)
Physician Discharge Summary  Dale Whitehead. WUJ:[id number] DOB: 09-13-1923 DOA: 04/14/2014  PCP: Dale Hillier, MD  Admit date: 04/14/2014 Discharge date: 04/20/2014  Time spent: 35 minutes  Recommendations for Outpatient Follow-up:  1. Follow up with PCP in 1-2 weeks 2. Follow up R wrist xray in one week for interval change 3. Follow interval INR checks while patient continues coumadin for hx of recurrent DVT, INR goal 2-3  Discharge Diagnoses:  Principal Problem:   Right humeral fracture Active Problems:   HTN (hypertension)   CAD (coronary artery disease) status post CABG 1985   History of DVT (deep vein thrombosis), recurrent   CKD (chronic kidney disease) stage 3, GFR 30-59 ml/min   Fx humeral neck  Discharge Condition: Stable  Diet recommendation: Heart healthy  Filed Weights   04/18/14 0615 04/19/14 0410 04/20/14 0700  Weight: 102 kg (224 lb 13.9 oz) 99.9 kg (220 lb 3.8 oz) 98.85 kg (217 lb 14.8 oz)    History of present illness:  See admit h and p from 9/16 for details. Briefly, pt presented after mechanical fall resulting in R humeral neck fx and wrist fx. Pt noted to be unable to ambulate or care for self. Pt was admitted for further evaluation.  Hospital Course:  R humeral fracture  1. Pt had been seen by Orthopedic surgery 2. Cont immobilizer per Ortho recs with no therapy to the shoulder for 4-5 weeks, then gentle progressive therapy 3. If shoulder does not heal with persistent pain, reverse total shoulder replacement recommended R scaphoid fracture  1. Orthopedic surgery following 2. Radiology recs for follow up wrist xray in 2 weeks from admission to assess for additional fx 3. Wrist brace continued HTN  1. Had been stable CAD  1. Stable DVT  1. Continue Coumadin per Pharmacy Acute on CKD  1. Baseline Cr of around 2 2. Had risen to 2.33, improved with gentle IVF 3. Stopped further fluids Abd distension  1. Decreased PO intake associated  with decreased BM and increased abd distension related to pain med use 2. Cathartics ordered with good results SOB  1.   CXR without evidence of acute CHF 2.   Weaned O2 as tolerated 3.   Suspect mild sob secondary to deconditioning  Consultations:  Orthopedic surgery  Discharge Exam: Filed Vitals:   04/19/14 1046 04/19/14 1420 04/19/14 2300 04/20/14 0700  BP:  132/90 138/79 108/96  Pulse: 90 98 88 92  Temp: 98.8 F (37.1 C) 97.9 F (36.6 C) 97.6 F (36.4 C) 98.8 F (37.1 C)  TempSrc: Oral Oral Oral Oral  Resp:  Height:      Weight:    98.85 kg (217 lb 14.8 oz)  SpO2: 96% 97% 100% 98%    General: awake, in nad Cardiovascular: regular, s1, s2 Respiratory: normal resp effort, no wheezing  Discharge Instructions     Medication List         acetaminophen 500 MG tablet  Commonly known as:  TYLENOL  Take 500-1,000 mg by mouth every 6 (six) hours as needed for mild pain.     clotrimazole 1 % cream  Commonly known as:  LOTRIMIN  Apply 1 application topically daily as needed (yeast).     docusate sodium 100 MG capsule  Commonly known as:  COLACE  Take 300 mg by mouth at bedtime.     famotidine 10 MG tablet  Commonly known as:  PEPCID  Take 10 mg by mouth daily as needed  for heartburn.     losartan 50 MG tablet  Commonly known as:  COZAAR  TAKE 1 TABLET BY MOUTH DAILY     metolazone 2.5 MG tablet  Commonly known as:  ZAROXOLYN  Take 2.5 mg by mouth every other day.     mirtazapine 15 MG tablet  Commonly known as:  REMERON  Take 15 mg by mouth at bedtime.     multivitamin tablet  Take 1 tablet by mouth daily.     mupirocin ointment 2 %  Commonly known as:  BACTROBAN  Place 1 application into the nose daily.     oxyCODONE-acetaminophen 5-325 MG per tablet  Commonly known as:  PERCOCET/ROXICET  Take 1-2 tablets by mouth every 4 (four) hours as needed for moderate pain.     polyethylene glycol packet  Commonly known as:  MIRALAX / GLYCOLAX   Take 17 g by mouth daily.     potassium chloride 10 MEQ tablet  Commonly known as:  K-DUR,KLOR-CON  Take 3 tablets (30 mEq total) by mouth daily.     silodosin 8 MG Caps capsule  Commonly known as:  RAPAFLO  Take 8 mg by mouth daily with breakfast.     torsemide 20 MG tablet  Commonly known as:  DEMADEX  Take 20 mg by mouth daily.     Vitamin D (Ergocalciferol) 50000 UNITS Caps capsule  Commonly known as:  DRISDOL  Take 50,000 Units by mouth every 7 (seven) days.     warfarin 4 MG tablet  Commonly known as:  COUMADIN  Take 4-6 mg by mouth every evening. Take  everyday except Wednesday take .       Allergies  Allergen Reactions  . Accupril [Quinapril Hcl] Cough  . Erythromycin Other (See Comments)    Very bad burning  . Niacin     REACTION: itching  . Paxil [Paroxetine Hcl] Other (See Comments)    unknown  . Statins Other (See Comments)    tremors   . Tamsulosin Hcl Other (See Comments)    "did nothing"  . Tramadol-Acetaminophen Rash      The results of significant diagnostics from this hospitalization (including imaging, microbiology, ancillary and laboratory) are listed below for reference.    Significant Diagnostic Studies: Dg Pelvis 1-2 Views  04/15/2014   CLINICAL DATA:  Fall.  Pain in the shoulder.  Less pain in the hips.  EXAM: PELVIS - 1-2 VIEW  COMPARISON:  None.  FINDINGS: Bones appear radiolucent. No evidence for acute fracture or dislocation. Patient has had previous kyphoplasty of the lumbar spine. Penile prosthesis is identified.  IMPRESSION: No evidence for acute  abnormality.   Electronically Signed   By: Rosalie Gums M.D.   On: 04/15/2014 02:09   Dg Shoulder Right  04/15/2014   CLINICAL DATA:  Status post fall.  Right shoulder pain.  EXAM: RIGHT SHOULDER - 2+ VIEW  COMPARISON:  None.  FINDINGS: There is a comminuted fracture involving the right humeral head and neck, with shortening at the fracture site and mild rotation of the humeral head. A  smaller lateral humeral head fragment demonstrates minimal displacement. The humeral head remains seated at the glenoid.  Mild degenerative changes noted at the right acromioclavicular joint. No additional fractures are seen. The visualized right lung appears grossly clear, though hypoexpanded.  IMPRESSION: Comminuted fracture involving the right humeral head and neck, with shortening at the fracture site.   Electronically Signed   By: Roanna Raider M.D.   On:  04/15/2014 02:09   Dg Wrist Complete Left  04/15/2014   CLINICAL DATA:  Left wrist pain and swelling.  EXAM: LEFT WRIST - COMPLETE 3+ VIEW  COMPARISON:  None.  FINDINGS: There is no evidence of fracture or dislocation. The carpal rows are intact, and demonstrate normal alignment. The joint spaces are preserved.  A focus of calcification radial to the scaphoid likely reflects soft tissue calcification, or may reflect remote injury.  IMPRESSION: No evidence of fracture or dislocation.   Electronically Signed   By: Roanna Raider M.D.   On: 04/15/2014 06:33   Dg Wrist Complete Right  04/15/2014   CLINICAL DATA:  Status post fall.  Right wrist pain.  EXAM: RIGHT WRIST - COMPLETE 3+ VIEW  COMPARISON:  None.  FINDINGS: An osseous fragment is noted along the radial aspect of the scaphoid, raising suspicion for a focal chip fracture from the radial aspect of the scaphoid. This is somewhat unusual. The scaphoid waist appears grossly intact.  The carpal rows are otherwise grossly unremarkable. Visualized joint spaces are preserved. No significant soft tissue abnormalities are characterized on radiograph.  IMPRESSION: Osseous fragment along the radial aspect of the scaphoid raises suspicion for a focal chip fracture from the radial aspect of the scaphoid. This is a somewhat unusual fracture. The scaphoid waist appears grossly intact, though follow-up wrist radiograph may be performed in 2 weeks to assess for an additional fracture line.   Electronically Signed    By: Roanna Raider M.D.   On: 04/15/2014 04:24   Ct Head Wo Contrast  04/19/2014   CLINICAL DATA:  Recent head injury with mental status change. The patient was confused and uncooperative on the CT table ; history of hypertension, coronary artery disease, and chronic renal insufficiency  EXAM: CT HEAD WITHOUT CONTRAST  TECHNIQUE: Contiguous axial images were obtained from the base of the skull through the vertex without intravenous contrast.  COMPARISON:  Noncontrast CT scan of the brain of April 15, 2014  FINDINGS: There is moderate diffuse cerebral and cerebellar atrophy with compensatory ventriculomegaly. There is no acute intracranial hemorrhage. No acute ischemic changes are demonstrated. Decreased density in the deep white matter of both cerebral hemispheres are consistent with chronic small vessel ischemic changes. The cerebellum and brainstem exhibit no acute abnormalities.  The observed paranasal sinuses and mastoid air cells are clear. There is no acute skull fracture.  IMPRESSION: Stable appearance of the brain since a study of 4 days ago. There is no acute ischemic or hemorrhagic event.   Electronically Signed   By: David  [place]   On: 04/19/2014 12:49   Ct Head Wo Contrast  04/15/2014   CLINICAL DATA:  Status post fall.  Headache.  EXAM: CT HEAD WITHOUT CONTRAST  TECHNIQUE: Contiguous axial images were obtained from the base of the skull through the vertex without intravenous contrast.  COMPARISON:  CT of the head performed 05/11/2011  FINDINGS: There is no evidence of acute infarction, mass lesion, or intra- or extra-axial hemorrhage on CT.  Persistent prominence of the ventricles and sulci reflects moderate to severe cortical volume loss. Mild periventricular and subcortical white matter change likely reflects small vessel ischemic microangiopathy. Cerebellar atrophy is noted.  The brainstem and fourth ventricle are within normal limits. The basal ganglia are unremarkable in appearance. The  cerebral hemispheres demonstrate grossly normal gray-white differentiation. No mass effect or midline shift is seen.  There is no evidence of fracture; visualized osseous structures are unremarkable in appearance. The orbits are  within normal limits. The paranasal sinuses and mastoid air cells are well-aerated. No significant soft tissue abnormalities are seen.  IMPRESSION: 1. No evidence of traumatic intracranial injury or fracture. 2. Moderate to severe cortical volume loss and scattered small vessel ischemic microangiopathy.   Electronically Signed   By: Roanna Raider M.D.   On: 04/15/2014 02:42   Dg Chest Port 1 View  04/20/2014   CLINICAL DATA:  Shortness of breath.  EXAM: PORTABLE CHEST - 1 VIEW  COMPARISON:  05/30/2012.  FINDINGS: Mediastinum and hilar structures are unremarkable. Cardiomegaly. Prior CABG. Pulmonary vascularity is normal. Bibasilar atelectasis. Poor inspiration. No pleural effusion or pneumothorax. Left shoulder replacement. Right proximal humeral fracture.  IMPRESSION: 1. Poor inspiration with mild bibasilar atelectasis. 2. Cardiomegaly.  Prior CABG.  No CHF. 3. Fractured proximal right humerus.  Left shoulder replacement.   Electronically Signed   By: Maisie Fus  Register   On: 04/20/2014 12:53   Dg Shoulder Left  04/15/2014   CLINICAL DATA:  Status post fall; mild left shoulder pain.  EXAM: LEFT SHOULDER - 2+ VIEW  COMPARISON:  None.  FINDINGS: There is no evidence of fracture or dislocation. The patient's left shoulder prosthesis appears grossly intact, with mild bony remodeling at the glenoid. There is no evidence of loosening. The acromioclavicular joint is unremarkable in appearance. No significant soft tissue abnormalities are seen. The visualized portions of the left lung are clear.  IMPRESSION: No evidence of fracture or dislocation. Left shoulder prosthesis appear grossly intact, with mild bony remodeling at the glenoid. No evidence of loosening.   Electronically Signed   By:  Roanna Raider M.D.   On: 04/15/2014 02:07    Microbiology: No results found for this or any previous visit (from the past 240 hour(s)).   Labs: Basic Metabolic Panel:  Recent Labs Lab 04/16/14 0420 04/17/14 0540 04/18/14 1044 04/19/14 0535 04/20/14 0616  NA 134* 134* 134* 135* 138  K 3.9 3.4* 4.6 3.7 3.4*  CL 90* 94* 94* 93* 96  CO2 GLUCOSE 174* 117* 132* 108* 126*  BUN 59* 50* 48* 48* 55*  CREATININE 2.33* 2.00* 1.87* 1.87* 1.99*  CALCIUM 8.9 8.3* 8.0* 7.7* 7.6*   Liver Function Tests:  Recent Labs Lab 04/16/14 0420  AST 37  ALT 23  ALKPHOS 89  BILITOT 0.3  PROT 7.0  ALBUMIN 2.9*   No results found for this basename: LIPASE, AMYLASE,  in the last 168 hours No results found for this basename: AMMONIA,  in the last 168 hours CBC:  Recent Labs Lab 04/15/14 0047 04/16/14 0420  WBC 12.2* 12.4*  NEUTROABS 9.8*  --   HGB 12.6* 11.7*  HCT 36.4* 34.3*  MCV 91.9 93.5  PLT 186 215   Cardiac Enzymes:  Recent Labs Lab 04/15/14 0047  TROPONINI <0.30   BNP: BNP (last 3 results) No results found for this basename: PROBNP,  in the last 8760 hours CBG:  Recent Labs Lab 04/18/14 2154  GLUCAP 117*   Signed:  CHIU, STEPHEN K  [facility] 04/20/2014, 1:37 PM

## 2014-06-05 ENCOUNTER — Other Ambulatory Visit: Payer: Self-pay | Admitting: Cardiovascular Disease

## 2014-06-05 NOTE — Telephone Encounter (Signed)
E sent to pharmacy 

## 2014-06-15 ENCOUNTER — Emergency Department (HOSPITAL_COMMUNITY): Payer: Medicare Other

## 2014-06-15 ENCOUNTER — Inpatient Hospital Stay (HOSPITAL_COMMUNITY)
Admission: EM | Admit: 2014-06-15 | Discharge: 2014-06-20 | DRG: 682 | Disposition: A | Discharge status: Hospice | Payer: Medicare Other | Attending: Internal Medicine | Admitting: Internal Medicine

## 2014-06-15 ENCOUNTER — Encounter (HOSPITAL_COMMUNITY): Payer: Self-pay | Admitting: Emergency Medicine

## 2014-06-15 DIAGNOSIS — G9341 Metabolic encephalopathy: Secondary | ICD-10-CM | POA: Diagnosis present

## 2014-06-15 DIAGNOSIS — E785 Hyperlipidemia, unspecified: Secondary | ICD-10-CM | POA: Diagnosis present

## 2014-06-15 DIAGNOSIS — Z96612 Presence of left artificial shoulder joint: Secondary | ICD-10-CM | POA: Diagnosis present

## 2014-06-15 DIAGNOSIS — I129 Hypertensive chronic kidney disease with stage 1 through stage 4 chronic kidney disease, or unspecified chronic kidney disease: Secondary | ICD-10-CM | POA: Diagnosis present

## 2014-06-15 DIAGNOSIS — N183 Chronic kidney disease, stage 3 unspecified: Secondary | ICD-10-CM | POA: Diagnosis present

## 2014-06-15 DIAGNOSIS — R4182 Altered mental status, unspecified: Secondary | ICD-10-CM | POA: Diagnosis present

## 2014-06-15 DIAGNOSIS — N179 Acute kidney failure, unspecified: Principal | ICD-10-CM

## 2014-06-15 DIAGNOSIS — S42291D Other displaced fracture of upper end of right humerus, subsequent encounter for fracture with routine healing: Secondary | ICD-10-CM | POA: Diagnosis not present

## 2014-06-15 DIAGNOSIS — E860 Dehydration: Secondary | ICD-10-CM | POA: Diagnosis present

## 2014-06-15 DIAGNOSIS — S62101D Fracture of unspecified carpal bone, right wrist, subsequent encounter for fracture with routine healing: Secondary | ICD-10-CM

## 2014-06-15 DIAGNOSIS — N184 Chronic kidney disease, stage 4 (severe): Secondary | ICD-10-CM | POA: Diagnosis present

## 2014-06-15 DIAGNOSIS — E871 Hypo-osmolality and hyponatremia: Secondary | ICD-10-CM | POA: Diagnosis not present

## 2014-06-15 DIAGNOSIS — Z9849 Cataract extraction status, unspecified eye: Secondary | ICD-10-CM

## 2014-06-15 DIAGNOSIS — E440 Moderate protein-calorie malnutrition: Secondary | ICD-10-CM | POA: Diagnosis present

## 2014-06-15 DIAGNOSIS — S42301A Unspecified fracture of shaft of humerus, right arm, initial encounter for closed fracture: Secondary | ICD-10-CM | POA: Diagnosis present

## 2014-06-15 DIAGNOSIS — Z7901 Long term (current) use of anticoagulants: Secondary | ICD-10-CM | POA: Diagnosis not present

## 2014-06-15 DIAGNOSIS — G4733 Obstructive sleep apnea (adult) (pediatric): Secondary | ICD-10-CM | POA: Diagnosis present

## 2014-06-15 DIAGNOSIS — K219 Gastro-esophageal reflux disease without esophagitis: Secondary | ICD-10-CM | POA: Diagnosis present

## 2014-06-15 DIAGNOSIS — I2510 Atherosclerotic heart disease of native coronary artery without angina pectoris: Secondary | ICD-10-CM | POA: Diagnosis present

## 2014-06-15 DIAGNOSIS — F329 Major depressive disorder, single episode, unspecified: Secondary | ICD-10-CM | POA: Diagnosis present

## 2014-06-15 DIAGNOSIS — Z951 Presence of aortocoronary bypass graft: Secondary | ICD-10-CM | POA: Diagnosis not present

## 2014-06-15 DIAGNOSIS — I10 Essential (primary) hypertension: Secondary | ICD-10-CM | POA: Diagnosis present

## 2014-06-15 DIAGNOSIS — Z86718 Personal history of other venous thrombosis and embolism: Secondary | ICD-10-CM | POA: Diagnosis not present

## 2014-06-15 DIAGNOSIS — G9340 Encephalopathy, unspecified: Secondary | ICD-10-CM | POA: Diagnosis present

## 2014-06-15 DIAGNOSIS — I252 Old myocardial infarction: Secondary | ICD-10-CM

## 2014-06-15 DIAGNOSIS — Z6835 Body mass index (BMI) 35.0-35.9, adult: Secondary | ICD-10-CM

## 2014-06-15 DIAGNOSIS — E870 Hyperosmolality and hypernatremia: Secondary | ICD-10-CM | POA: Diagnosis present

## 2014-06-15 DIAGNOSIS — Z515 Encounter for palliative care: Secondary | ICD-10-CM

## 2014-06-15 DIAGNOSIS — N19 Unspecified kidney failure: Secondary | ICD-10-CM | POA: Clinically undetermined

## 2014-06-15 DIAGNOSIS — F0391 Unspecified dementia with behavioral disturbance: Secondary | ICD-10-CM | POA: Diagnosis present

## 2014-06-15 DIAGNOSIS — T148XXA Other injury of unspecified body region, initial encounter: Secondary | ICD-10-CM

## 2014-06-15 DIAGNOSIS — J9811 Atelectasis: Secondary | ICD-10-CM | POA: Diagnosis present

## 2014-06-15 DIAGNOSIS — Z9981 Dependence on supplemental oxygen: Secondary | ICD-10-CM | POA: Diagnosis not present

## 2014-06-15 HISTORY — DX: Unspecified dementia, unspecified severity, without behavioral disturbance, psychotic disturbance, mood disturbance, and anxiety: F03.90

## 2014-06-15 LAB — COMPREHENSIVE METABOLIC PANEL
ALBUMIN: 2.9 g/dL — AB (ref 3.5–5.2)
ALK PHOS: 143 U/L — AB (ref 39–117)
ALT: 53 U/L (ref 0–53)
AST: 50 U/L — ABNORMAL HIGH (ref 0–37)
Anion gap: 19 — ABNORMAL HIGH (ref 5–15)
BUN: 138 mg/dL — ABNORMAL HIGH (ref 6–23)
CHLORIDE: 95 meq/L — AB (ref 96–112)
CO2: 25 mEq/L (ref 19–32)
Calcium: 9.8 mg/dL (ref 8.4–10.5)
Creatinine, Ser: 3.52 mg/dL — ABNORMAL HIGH (ref 0.50–1.35)
GFR calc Af Amer: 16 mL/min — ABNORMAL LOW (ref >90)
GFR calc non Af Amer: 14 mL/min — ABNORMAL LOW (ref >90)
Glucose, Bld: 135 mg/dL — ABNORMAL HIGH (ref 70–99)
POTASSIUM: 4.5 meq/L (ref 3.7–5.3)
Sodium: 139 mEq/L (ref 137–147)
TOTAL PROTEIN: 8.1 g/dL (ref 6.0–8.3)
Total Bilirubin: <0.2 mg/dL — ABNORMAL LOW (ref 0.3–1.2)

## 2014-06-15 LAB — URINALYSIS, ROUTINE W REFLEX MICROSCOPIC
BILIRUBIN URINE: NEGATIVE
Glucose, UA: NEGATIVE mg/dL
Hgb urine dipstick: NEGATIVE
KETONES UR: NEGATIVE mg/dL
Leukocytes, UA: NEGATIVE
NITRITE: NEGATIVE
Protein, ur: NEGATIVE mg/dL
Specific Gravity, Urine: 1.013 (ref 1.005–1.030)
UROBILINOGEN UA: 0.2 mg/dL (ref 0.0–1.0)
pH: 5 (ref 5.0–8.0)

## 2014-06-15 LAB — PHOSPHORUS: PHOSPHORUS: 4.3 mg/dL (ref 2.3–4.6)

## 2014-06-15 LAB — PROTIME-INR
INR: 3.88 — ABNORMAL HIGH (ref 0.00–1.49)
Prothrombin Time: 38.4 seconds — ABNORMAL HIGH (ref 11.6–15.2)

## 2014-06-15 LAB — CBC
HCT: 39.3 % (ref 39.0–52.0)
Hemoglobin: 13 g/dL (ref 13.0–17.0)
MCH: 30.9 pg (ref 26.0–34.0)
MCHC: 33.1 g/dL (ref 30.0–36.0)
MCV: 93.3 fL (ref 78.0–100.0)
Platelets: 294 10*3/uL (ref 150–400)
RBC: 4.21 MIL/uL — ABNORMAL LOW (ref 4.22–5.81)
RDW: 13.4 % (ref 11.5–15.5)
WBC: 12.9 10*3/uL — AB (ref 4.0–10.5)

## 2014-06-15 LAB — RAPID URINE DRUG SCREEN, HOSP PERFORMED
Amphetamines: NOT DETECTED
BARBITURATES: NOT DETECTED
BENZODIAZEPINES: NOT DETECTED
COCAINE: NOT DETECTED
Opiates: NOT DETECTED
TETRAHYDROCANNABINOL: NOT DETECTED

## 2014-06-15 LAB — MAGNESIUM: Magnesium: 3.9 mg/dL — ABNORMAL HIGH (ref 1.5–2.5)

## 2014-06-15 LAB — ETHANOL: Alcohol, Ethyl (B): <11 mg/dL (ref 0–11)

## 2014-06-15 MED ORDER — POLYETHYLENE GLYCOL 3350 17 G PO PACK
17.0000 g | PACK | Freq: Every day | ORAL | Status: DC
  Administered 2014-06-15 – 2014-06-20 (×5): 17 g via ORAL
  Filled 2014-06-15 (×7): qty 1

## 2014-06-15 MED ORDER — ADULT MULTIVITAMIN W/MINERALS CH
1.0000 | ORAL_TABLET | Freq: Every day | ORAL | Status: DC
  Administered 2014-06-17 – 2014-06-20 (×4): 1 via ORAL
  Filled 2014-06-15 (×5): qty 1

## 2014-06-15 MED ORDER — METOLAZONE 2.5 MG PO TABS: 2.5000 mg | ORAL_TABLET | ORAL | Status: DC

## 2014-06-15 MED ORDER — TAMSULOSIN HCL 0.4 MG PO CAPS
0.4000 mg | ORAL_CAPSULE | Freq: Every day | ORAL | Status: DC
  Administered 2014-06-17 – 2014-06-20 (×4): 0.4 mg via ORAL
  Filled 2014-06-15 (×5): qty 1

## 2014-06-15 MED ORDER — HYDROCODONE-ACETAMINOPHEN 5-325 MG PO TABS
1.0000 | ORAL_TABLET | Freq: Three times a day (TID) | ORAL | Status: DC | PRN
  Administered 2014-06-15 – 2014-06-20 (×11): 1 via ORAL
  Filled 2014-06-15 (×11): qty 1

## 2014-06-15 MED ORDER — DOCUSATE SODIUM 100 MG PO CAPS
300.0000 mg | ORAL_CAPSULE | Freq: Every day | ORAL | Status: DC
  Administered 2014-06-15 – 2014-06-19 (×4): 300 mg via ORAL
  Filled 2014-06-15 (×5): qty 3

## 2014-06-15 MED ORDER — VITAMIN D (ERGOCALCIFEROL) 1.25 MG (50000 UNIT) PO CAPS: 50000.0000 [IU] | ORAL_CAPSULE | ORAL | Status: DC

## 2014-06-15 MED ORDER — FAMOTIDINE 10 MG PO TABS
10.0000 mg | ORAL_TABLET | Freq: Every day | ORAL | Status: DC | PRN
  Filled 2014-06-15: qty 1

## 2014-06-15 MED ORDER — TORSEMIDE 20 MG PO TABS: 20.0000 mg | ORAL_TABLET | Freq: Every day | ORAL | Status: DC

## 2014-06-15 MED ORDER — SODIUM CHLORIDE 0.9 % IV BOLUS (SEPSIS)
500.0000 mL | Freq: Once | INTRAVENOUS | Status: AC
Start: 2014-06-15 — End: 2014-06-15
  Administered 2014-06-15: 500 mL via INTRAVENOUS

## 2014-06-15 MED ORDER — ONDANSETRON HCL 4 MG PO TABS: 4.0000 mg | ORAL_TABLET | Freq: Four times a day (QID) | ORAL | Status: DC | PRN

## 2014-06-15 MED ORDER — SODIUM CHLORIDE 0.9 % IJ SOLN
3.0000 mL | Freq: Two times a day (BID) | INTRAMUSCULAR | Status: DC
  Administered 2014-06-15 – 2014-06-20 (×4): 3 mL via INTRAVENOUS

## 2014-06-15 MED ORDER — ACETAMINOPHEN 500 MG PO TABS: 500.0000 mg | ORAL_TABLET | Freq: Four times a day (QID) | ORAL | Status: DC | PRN

## 2014-06-15 MED ORDER — CLOTRIMAZOLE 1 % EX CREA: 1.0000 "application " | TOPICAL_CREAM | Freq: Every day | CUTANEOUS | Status: DC | PRN

## 2014-06-15 MED ORDER — ONDANSETRON HCL 4 MG/2ML IJ SOLN: 4.0000 mg | Freq: Four times a day (QID) | INTRAMUSCULAR | Status: DC | PRN

## 2014-06-15 MED ORDER — ALPRAZOLAM 0.25 MG PO TABS: 0.2500 mg | ORAL_TABLET | Freq: Two times a day (BID) | ORAL | Status: DC | PRN

## 2014-06-15 MED ORDER — MIRTAZAPINE 15 MG PO TABS
15.0000 mg | ORAL_TABLET | Freq: Every day | ORAL | Status: DC
  Administered 2014-06-15 – 2014-06-19 (×4): 15 mg via ORAL
  Filled 2014-06-15 (×5): qty 1

## 2014-06-15 NOTE — ED Provider Notes (Signed)
CSN: [id number]     Arrival date & time 06/15/14  1359 History   First MD Initiated Contact with Patient 06/15/14 1526     Chief Complaint  Patient presents with  . Aggressive Behavior     (Consider location/radiation/quality/duration/timing/severity/associated sxs/prior Treatment) Patient is a 80 y.o. male presenting with altered mental status.  Altered Mental Status Presenting symptoms: behavior changes, combativeness, confusion, disorientation and memory loss   Severity:  Moderate Episode history:  Continuous Duration:  3 days Timing:  Constant Progression:  Worsening Chronicity:  Chronic Context: dementia (recent dx) and recent change in medication (on narcotics)   Context: not head injury   Associated symptoms: no abdominal pain, no nausea and no vomiting     Past Medical History  Diagnosis Date  . HLD (hyperlipidemia)   . HTN (hypertension)   . Sleep apnea   . CAD (coronary artery disease)   . DVT (deep venous thrombosis)     right leg  . Bilateral lower extremity edema     chronic  . Right HF (heart failure)   . S/P CABG (coronary artery bypass graft) 1985  . CHF (congestive heart failure)   . Family history of anesthesia complication     daughter has nausea  . Myocardial infarction   . Heart murmur   . Shortness of breath   . Depression   . Renal insufficiency   . GERD (gastroesophageal reflux disease)   . Arthritis   . Enlarged prostate   . Dementia    Past Surgical History  Procedure Laterality Date  . Coronary artery bypass graft  1985  . Cataract extraction, bilateral    . Back surgery    . Appendectomy    . Cardiac catheterization     Family History  Problem Relation Age of Onset  . Heart disease Father   . Heart disease Son   . Heart disease Paternal Grandfather    History  Substance Use Topics  . Smoking status: Never Smoker   . Smokeless tobacco: Never Used  . Alcohol Use: No    Review of Systems  Gastrointestinal: Negative for  nausea, vomiting and abdominal pain.  Psychiatric/Behavioral: Positive for memory loss and confusion.  All other systems reviewed and are negative.     Allergies  Accupril; Erythromycin; Niacin; Paxil; Statins; and Tramadol-acetaminophen  Home Medications   Prior to Admission medications   Medication Sig Start Date End Date Taking? Authorizing Provider  acetaminophen (TYLENOL) 500 MG tablet Take 500-1,000 mg by mouth every 6 (six) hours as needed for mild pain.   Yes Historical Provider, MD  ALPRAZolam (XANAX) 0.25 MG tablet Take 0.25 mg by mouth 2 (two) times daily as needed for anxiety.   Yes Historical Provider, MD  clotrimazole (LOTRIMIN) 1 % cream Apply 1 application topically daily as needed (yeast).  07/18/13  Yes Historical Provider, MD  docusate sodium (COLACE) 100 MG capsule Take 300 mg by mouth at bedtime.   Yes Historical Provider, MD  famotidine (PEPCID) 10 MG tablet Take 10 mg by mouth daily as needed for heartburn.    Yes Historical Provider, MD  losartan (COZAAR) 50 MG tablet TAKE 1 TABLET BY MOUTH DAILY   Yes Mihai Croitoru, MD  metolazone (ZAROXOLYN) 2.5 MG tablet Take 2.5 mg by mouth every other day.   Yes Historical Provider, MD  mirtazapine (REMERON) 15 MG tablet Take 15 mg by mouth at bedtime.   Yes Historical Provider, MD  Multiple Vitamin (MULTIVITAMIN) tablet Take 1 tablet by  mouth daily.   Yes Historical Provider, MD  mupirocin ointment (BACTROBAN) 2 % Place 1 application into the nose daily. As needed   Yes Historical Provider, MD  oxyCODONE-acetaminophen (PERCOCET/ROXICET) 5-325 MG per tablet Take 1-2 tablets by mouth every 4 (four) hours as needed for moderate pain. 04/20/14  Yes Jerald KiefStephen K Chiu, MD  Polyethyl Glycol-Propyl Glycol (SYSTANE OP) Apply 1 drop to eye daily as needed (dry/irritated eyes).   Yes Historical Provider, MD  polyethylene glycol (MIRALAX / GLYCOLAX) packet Take 17 g by mouth daily. Patient taking differently: Take 17 g by mouth daily as  needed for mild constipation or moderate constipation.  04/20/14  Yes Jerald KiefStephen K Chiu, MD  potassium chloride (K-DUR,KLOR-CON) 10 MEQ tablet Take 3 tablets (30 mEq total) by mouth daily. 11/05/13  Yes Mihai Croitoru, MD  silodosin (RAPAFLO) 8 MG CAPS capsule Take 8 mg by mouth daily with breakfast.   Yes Historical Provider, MD  torsemide (DEMADEX) 20 MG tablet TAKE 1 TABLET BY MOUTH DAILY AS NEEDED 06/05/14  Yes Mihai Croitoru, MD  Vitamin D, Ergocalciferol, (DRISDOL) 50000 UNITS CAPS capsule Take 50,000 Units by mouth every 7 (seven) days. Every monday   Yes Historical Provider, MD  warfarin (COUMADIN) 1 MG tablet Take 1 mg by mouth daily. Every Saturday, Sunday, Tuesday, and Thursday take 3.5 (combination of warfarin 2.5mg  and warfarin 1mg ) Every Monday, Wednesday, and Friday take 2.5mg .   Yes Historical Provider, MD  warfarin (COUMADIN) 2.5 MG tablet Take 2.5 mg by mouth daily. Every Monday, Wednesday, and Friday take 2.5 Every Saturday, Sunday, Tuesday, and Thursday take 3.5 (combination of warfarin 2.5mg  and warfarin 1mg )   Yes Historical Provider, MD  HYDROcodone-acetaminophen (NORCO/VICODIN) 5-325 MG per tablet Take 1 tablet by mouth 3 (three) times daily as needed for moderate pain or severe pain.    Historical Provider, MD  warfarin (COUMADIN) 4 MG tablet Take 4-6 mg by mouth every evening. Take 4mg  everyday except Wednesday take 6mg .    Historical Provider, MD   BP 123/82 mmHg  Pulse 96  Temp(Src) 97.7 F (36.5 C) (Oral)  Resp 20  Ht 5\' 1"  (1.549 m)  Wt 179 lb 0.2 oz (81.2 kg)  BMI 33.84 kg/m2  SpO2 98% Physical Exam  Constitutional: He appears well-developed and well-nourished.  HENT:  Head: Normocephalic and atraumatic.  Eyes: Conjunctivae and EOM are normal.  Neck: Normal range of motion. Neck supple.  Cardiovascular: Normal rate, regular rhythm and normal heart sounds.   Pulmonary/Chest: Effort normal and breath sounds normal. No respiratory distress.  Abdominal: He exhibits no  distension. There is no tenderness. There is no rebound and no guarding.  Musculoskeletal: Normal range of motion.  Neurological: He is alert. GCS eye subscore is 4. GCS verbal subscore is 4. GCS motor subscore is 6.  MAE, limited exam secondary to compliance  Skin: Skin is warm and dry.  Ulceration over R tib to dermis  Vitals reviewed.   ED Course  Procedures (including critical care time) Labs Review Labs Reviewed  CBC - Abnormal; Notable for the following:    WBC 12.9 (*)    RBC 4.21 (*)    All other components within normal limits  COMPREHENSIVE METABOLIC PANEL - Abnormal; Notable for the following:    Chloride 95 (*)    Glucose, Bld 135 (*)    BUN 138 (*)    Creatinine, Ser 3.52 (*)    Albumin 2.9 (*)    AST 50 (*)    Alkaline Phosphatase 143 (*)  Total Bilirubin <0.2 (*)    GFR calc non Af Amer 14 (*)    GFR calc Af Amer 16 (*)    Anion gap 19 (*)    All other components within normal limits  PROTIME-INR - Abnormal; Notable for the following:    Prothrombin Time 38.4 (*)    INR 3.88 (*)    All other components within normal limits  MAGNESIUM - Abnormal; Notable for the following:    Magnesium 3.9 (*)    All other components within normal limits  ETHANOL  URINE RAPID DRUG SCREEN (HOSP PERFORMED)  URINALYSIS, ROUTINE W REFLEX MICROSCOPIC  PHOSPHORUS  TSH  COMPREHENSIVE METABOLIC PANEL  CBC  PROTIME-INR    Imaging Review Dg Chest 2 View  06/15/2014   CLINICAL DATA:  Aggressive behavior and confusion since 06/11/2014, weakness, shortness of breath, on home oxygen, dementia, history CHF, hypertension, coronary disease post MI and CABG  EXAM: CHEST  2 VIEW  COMPARISON:  04/20/2014  FINDINGS: Enlargement of cardiac silhouette post median sternotomy.  Stable mediastinal contours.  Normal pulmonary vascularity.  Decreased lung volumes with crowding of perihilar markings and minimal basilar atelectasis.  No acute infiltrate, pleural effusion or pneumothorax.  Post  LEFT shoulder replacement.  Subacute fracture proximal RIGHT humerus again noted.  IMPRESSION: Low lung volumes with bibasilar atelectasis.  Enlargement of cardiac silhouette post median sternotomy.   Electronically Signed   By: Ulyses Southward M.D.   On: 06/15/2014 17:08   Ct Head Wo Contrast  06/15/2014   CLINICAL DATA:  80 year old male with dementia with aggressive behavior and confusion since 06/19/2014. Initial encounter.  EXAM: CT HEAD WITHOUT CONTRAST  TECHNIQUE: Contiguous axial images were obtained from the base of the skull through the vertex without intravenous contrast.  COMPARISON:  04/19/2014.  FINDINGS: No intracranial hemorrhage.  Small vessel disease type changes without CT evidence of large acute infarct.  Atrophy. Ventricular prominence stable and may reflect central atrophy although mild stable hydrocephalus may also be present.  No intracranial mass lesion noted on this unenhanced exam.  Vascular calcifications.  Opacification left sphenoid sinus.  IMPRESSION: No intracranial hemorrhage.  Small vessel disease type changes without CT evidence of large acute infarct.  Atrophy. Ventricular prominence stable and may reflect central atrophy although mild stable hydrocephalus may also be present.  Opacification left sphenoid sinus.   Electronically Signed   By: Bridgett Larsson M.D.   On: 06/15/2014 17:37     EKG Interpretation None      MDM   Final diagnoses:  Altered mental status    80 y.o. male with pertinent PMH of recently dx dementia, recent scapular and shoulder fx, CAD, CKD, dCHF (EF 60% in 2014) presents with acute on chronic ams x 3 days.  No fevers, gi symptoms.  Pt lives at home with daughter per his lifelong wish to not be in a facility and family has no wish to have him placed.  On arrival today vitals and physical exam as above.  Pt has reducible midline hernia, MAE, and will answer to name alone.  No focal deficits in context of limited exam.  Obtained labs which were  remarkable for AKI and uremia, which is likely source of AMS.  Admitted in stable condition.      1. Altered mental status         Mirian Mo, MD 06/15/14 [phone]

## 2014-06-15 NOTE — ED Notes (Signed)
Upon in and out catheter. Pt brief changed. Pt buttocks reddened with breakdown. Pt repositioned from area for comfort.

## 2014-06-15 NOTE — ED Notes (Signed)
Bed: WA11 Expected date:  Expected time:  Means of arrival:  Comments: EMS 

## 2014-06-15 NOTE — Progress Notes (Signed)
Patient c/o feeling short of breath,.oxygen saturation on 4 Liters/minute was 99%. The tech assisted the RN in repositioning the patient. The patient tolerated this very well and seemed to relax after being repositioned. Will continue to monitor the patient

## 2014-06-15 NOTE — ED Notes (Signed)
[facility] [place] Per ems pt dx dementia recently. Per daughter pt co aggressive behavior and confusion since 11/12. Pt is on home oxygen 4 L [place]. Pt is  soft verbally and nonambulatory. Pt presents with preexisting right shoulder injury. Pt had arm sling applied prior to arrival. Per EMS VS 100 palpated BP, O2 96 %  4 L pulse 100, CBG 191.

## 2014-06-15 NOTE — H&P (Addendum)
[facility] History and Physical  [name]. JYN:[id number] DOB: May 06, 1924 DOA: 06/15/2014  Referring physician: ED physician PCP: [name], MD   Chief Complaint: altered mental status  HPI:  80 year old male with past medical history of dementia, hypertension, CXD stage 3, CAD status post CABG, DVT on anticoagulation with coumadin who presented from SNF to [facility] ED 06/15/2014 with more altered mental status, more aggressive behavior and confusion for past few days prior to this admission. Pt is not a good historian due to his mental status. Family not at the bedside to provide details of medical history. No reports of respiratory distress. No reports of fevers or cough. No reports of falls or loss of consciousness. No reports of diarrhea or constipation. No vomiting. No reports of blood in stool or urine.    In ED, BP is 145/113 , HR 100-109, RR 18, T max 98.6 F and oxygen saturation of 97% on [place] oxygen support. Blood work showed WBC count of 12.9, creatinine of 3.52, INR 3.88. No acute intracranial findings seen on CT head. CXR showed low lung volumes with bibasilar atelectasis. Pt admitted for further evaluation of altered mental status.   Assessment and Plan:  Principal Problem:   Acute encephalopathy  - possible from progression of dementia imposed on dehydration, ? Uremia  - CT head showed no acute intracranial findings  - no evidence of infection based on urinalysis or cxr - PT evaluation once patient able to participate  - gently hydration and reassess clinical status in AM Active Problems:   HTN (hypertension), essential - continue losartan, torsemide until AM and reassess clinical status in AM - BP is 149/95   CAD (coronary artery disease) status post CABG 1985 - continue current meds for BP, anticoagulation for DVT history - no complaints of chest pain    History of DVT (deep vein thrombosis), recurrent / supratherapeutic INR - on anticoagulation  with coumadin  - dosing per pharmacy; will place coumadin on hold for tonight due to supratherapeutic INR    CKD (chronic kidney disease) stage 3, GFR 30-59 ml/min - creatinine is 3.52 on this admission  - baseline creatinine 9 months prior to this admission was 2.28 - likely dehydration, prerenal although patient is on few potentially nephrotoxic agents: diuretics and losartan - will hold Torsemide and Losartan for now    Right humeral fracture  - recently hospitalized for right humeral fracture - stable  - PT eval once able to tolerate   Radiological Exams on Admission: Dg Chest 2 View 06/15/2014 Low lung volumes with bibasilar atelectasis.  Enlargement of cardiac silhouette post median sternotomy.     Ct Head Wo Contrast 06/15/2014   No intracranial hemorrhage.  Small vessel disease type changes without CT evidence of large acute infarct.  Atrophy. Ventricular prominence stable and may reflect central atrophy although mild stable hydrocephalus may also be present.  Opacification left sphenoid sinus.      Code Status: Full Family Communication: Family at the bedside  Disposition Plan: Admit for further evaluation     Review of Systems:  Altered mental status   Past Medical History  Diagnosis Date  . HLD (hyperlipidemia)   . HTN (hypertension)   . Sleep apnea   . CAD (coronary artery disease)   . DVT (deep venous thrombosis)     right leg  . Bilateral lower extremity edema     chronic  . Right HF (heart failure)   . S/P CABG (coronary artery  bypass graft) 1985  . CHF (congestive heart failure)   . Family history of anesthesia complication     daughter has nausea  . Myocardial infarction   . Heart murmur   . Shortness of breath   . Depression   . Renal insufficiency   . GERD (gastroesophageal reflux disease)   . Arthritis   . Enlarged prostate   . Dementia     Past Surgical History  Procedure Laterality Date  . Coronary artery bypass graft  1985  . Cataract  extraction, bilateral    . Back surgery    . Appendectomy    . Cardiac catheterization      Social History:  reports that he has never smoked. He has never used smokeless tobacco. He reports that he does not drink alcohol or use illicit drugs.  Allergies  Allergen Reactions  . Accupril [Quinapril Hcl] Cough  . Erythromycin Other (See Comments)    Very bad burning  . Niacin     REACTION: itching  . Paxil [Paroxetine Hcl] Other (See Comments)    unknown  . Statins Other (See Comments)    tremors   . Tamsulosin Hcl Other (See Comments)    "did nothing"  . Tramadol-Acetaminophen Rash    Family History  Problem Relation Age of Onset  . Heart disease Father   . Heart disease Son   . Heart disease Paternal Grandfather     Prior to Admission medications   Medication Sig Start Date End Date Taking? Authorizing Provider  acetaminophen (TYLENOL) 500 MG tablet Take 500-1,000 mg by mouth every 6 (six) hours as needed for mild pain.   Yes Historical Provider, MD  ALPRAZolam (XANAX) 0.25 MG tablet Take 0.25 mg by mouth 2 (two) times daily as needed for anxiety.   Yes Historical Provider, MD  clotrimazole (LOTRIMIN) 1 % cream Apply 1 application topically daily as needed (yeast).  07/18/13  Yes Historical Provider, MD  docusate sodium (COLACE) 100 MG capsule Take 300 mg by mouth at bedtime.   Yes Historical Provider, MD  famotidine (PEPCID) 10 MG tablet Take 10 mg by mouth daily as needed for heartburn.    Yes Historical Provider, MD  losartan (COZAAR) 50 MG tablet TAKE 1 TABLET BY MOUTH DAILY   Yes Mihai Croitoru, MD  metolazone (ZAROXOLYN) 2.5 MG tablet Take 2.5 mg by mouth every other day.   Yes Historical Provider, MD  mirtazapine (REMERON) 15 MG tablet Take 15 mg by mouth at bedtime.   Yes Historical Provider, MD  Multiple Vitamin (MULTIVITAMIN) tablet Take 1 tablet by mouth daily.   Yes Historical Provider, MD  mupirocin ointment (BACTROBAN) 2 % Place 1 application into the nose  daily. As needed   Yes Historical Provider, MD  oxyCODONE-acetaminophen (PERCOCET/ROXICET) 5-325 MG per tablet Take 1-2 tablets by mouth every 4 (four) hours as needed for moderate pain. 04/20/14  Yes Jerald KiefStephen K Chiu, MD  Polyethyl Glycol-Propyl Glycol (SYSTANE OP) Apply 1 drop to eye daily as needed (dry/irritated eyes).   Yes Historical Provider, MD  polyethylene glycol (MIRALAX / GLYCOLAX) packet Take 17 g by mouth daily. Patient taking differently: Take 17 g by mouth daily as needed for mild constipation or moderate constipation.  04/20/14  Yes Jerald KiefStephen K Chiu, MD  potassium chloride (K-DUR,KLOR-CON) 10 MEQ tablet Take 3 tablets (30 mEq total) by mouth daily. 11/05/13  Yes Mihai Croitoru, MD  silodosin (RAPAFLO) 8 MG CAPS capsule Take 8 mg by mouth daily with breakfast.   Yes  Historical Provider, MD  torsemide (DEMADEX) 20 MG tablet TAKE 1 TABLET BY MOUTH DAILY AS NEEDED 06/05/14  Yes Mihai Croitoru, MD  Vitamin D, Ergocalciferol, (DRISDOL) 50000 UNITS CAPS capsule Take 50,000 Units by mouth every 7 (seven) days. Every monday   Yes Historical Provider, MD  warfarin (COUMADIN) 1 MG tablet Take 1 mg by mouth daily. Every Saturday, Sunday, Tuesday, and Thursday take 3.5 (combination of warfarin 2.5mg  and warfarin 1mg ) Every Monday, Wednesday, and Friday take 2.5mg .   Yes Historical Provider, MD  warfarin (COUMADIN) 2.5 MG tablet Take 2.5 mg by mouth daily. Every Monday, Wednesday, and Friday take 2.5 Every Saturday, Sunday, Tuesday, and Thursday take 3.5 (combination of warfarin 2.5mg  and warfarin 1mg )   Yes Historical Provider, MD  HYDROcodone-acetaminophen (NORCO/VICODIN) 5-325 MG per tablet Take 1 tablet by mouth 3 (three) times daily as needed for moderate pain or severe pain.    Historical Provider, MD  torsemide (DEMADEX) 20 MG tablet Take 20 mg by mouth daily.  01/29/13   Mihai Croitoru, MD  warfarin (COUMADIN) 4 MG tablet Take 4-6 mg by mouth every evening. Take 4mg  everyday except Wednesday take 6mg .     Historical Provider, MD    Physical Exam: Filed Vitals:   06/15/14 1407 06/15/14 1500 06/15/14 1558 06/15/14 1754  BP: 113/86 135/67 138/90 145/95  Pulse: 100 107 109 100  Temp: 97.6 F (36.4 C) 98.4 F (36.9 C)  98.6 F (37 C)  TempSrc: Oral Oral  Oral  Resp: 18 12 16 13   SpO2: 97% 97% 97% 100%    Physical Exam  Constitutional: Appears well-developed and well-nourished. No distress.  HENT: Normocephalic. No tonsillar erythema or exudates  Dry MM Eyes: Conjunctivae  are normal. PERRLA, no scleral icterus.  Neck: Neck supple. No JVD. No tracheal deviation. No thyromegaly.  CVS: RRR, S1/S2 appreciated  Pulmonary: Effort and breath sounds normal, no stridor, rhonchi, wheezes, rales.  Abdominal: Soft. BS +,  no distension, tenderness, rebound or guarding.  Musculoskeletal: tenderness in right shoulder area Lymphadenopathy: No lymphadenopathy noted, cervical, inguinal. Neuro: Alert. No focal neurologic deficits  Skin: Skin is warm and dry.  Psychiatric: Combative, restless   Labs on Admission:  Basic Metabolic Panel:  Recent Labs Lab 06/15/14 1447  NA 139  K 4.5  CL 95*  CO2 25  GLUCOSE 135*  BUN 138*  CREATININE 3.52*  CALCIUM 9.8   Liver Function Tests:  Recent Labs Lab 06/15/14 1447  AST 50*  ALT 53  ALKPHOS 143*  BILITOT <0.2*  PROT 8.1  ALBUMIN 2.9*   CBC:  Recent Labs Lab 06/15/14 1447  WBC 12.9*  HGB 13.0  HCT 39.3  MCV 93.3  PLT 294     MAGICK-Jheri Mitter, Sherlon HandingISKRA, MD  [facility] Pager [phone]  If 7PM-7AM, please contact night-coverage [email or web address] Password [email or web address] 06/15/2014, 6:15 PM

## 2014-06-15 NOTE — Progress Notes (Signed)
Pt brought up from ED around 1830.  [facility]  VS checked and stable.  Heart monitor put on and called CCMD.  Gave pt new gown and sheets.  Cleaned peri area and put on condom cath.

## 2014-06-16 DIAGNOSIS — N179 Acute kidney failure, unspecified: Secondary | ICD-10-CM

## 2014-06-16 DIAGNOSIS — E440 Moderate protein-calorie malnutrition: Secondary | ICD-10-CM | POA: Insufficient documentation

## 2014-06-16 LAB — PROTIME-INR
INR: 3.62 — ABNORMAL HIGH (ref 0.00–1.49)
Prothrombin Time: 36.3 seconds — ABNORMAL HIGH (ref 11.6–15.2)

## 2014-06-16 LAB — COMPREHENSIVE METABOLIC PANEL
ALBUMIN: 2.9 g/dL — AB (ref 3.5–5.2)
ALT: 54 U/L — ABNORMAL HIGH (ref 0–53)
AST: 48 U/L — AB (ref 0–37)
Alkaline Phosphatase: 134 U/L — ABNORMAL HIGH (ref 39–117)
Anion gap: 20 — ABNORMAL HIGH (ref 5–15)
BUN: 133 mg/dL — ABNORMAL HIGH (ref 6–23)
CO2: 26 mEq/L (ref 19–32)
CREATININE: 3.28 mg/dL — AB (ref 0.50–1.35)
Calcium: 9.5 mg/dL (ref 8.4–10.5)
Chloride: 94 mEq/L — ABNORMAL LOW (ref 96–112)
GFR calc Af Amer: 18 mL/min — ABNORMAL LOW (ref >90)
GFR calc non Af Amer: 15 mL/min — ABNORMAL LOW (ref >90)
Glucose, Bld: 138 mg/dL — ABNORMAL HIGH (ref 70–99)
POTASSIUM: 3.7 meq/L (ref 3.7–5.3)
Sodium: 140 mEq/L (ref 137–147)
Total Bilirubin: 0.2 mg/dL — ABNORMAL LOW (ref 0.3–1.2)
Total Protein: 7.7 g/dL (ref 6.0–8.3)

## 2014-06-16 LAB — CBC
HEMATOCRIT: 37.6 % — AB (ref 39.0–52.0)
Hemoglobin: 12.1 g/dL — ABNORMAL LOW (ref 13.0–17.0)
MCH: 30.3 pg (ref 26.0–34.0)
MCHC: 32.2 g/dL (ref 30.0–36.0)
MCV: 94 fL (ref 78.0–100.0)
Platelets: 279 10*3/uL (ref 150–400)
RBC: 4 MIL/uL — ABNORMAL LOW (ref 4.22–5.81)
RDW: 13.4 % (ref 11.5–15.5)
WBC: 12.8 10*3/uL — AB (ref 4.0–10.5)

## 2014-06-16 LAB — TSH: TSH: 2.23 u[IU]/mL (ref 0.350–4.500)

## 2014-06-16 LAB — GLUCOSE, CAPILLARY: GLUCOSE-CAPILLARY: 170 mg/dL — AB (ref 70–99)

## 2014-06-16 MED ORDER — SODIUM CHLORIDE 0.9 % IV SOLN
INTRAVENOUS | Status: DC
  Administered 2014-06-16 – 2014-06-18 (×4): via INTRAVENOUS

## 2014-06-16 MED ORDER — ENSURE PUDDING PO PUDG
1.0000 | ORAL | Status: DC
  Administered 2014-06-16 – 2014-06-19 (×4): 1 via ORAL
  Filled 2014-06-16 (×6): qty 1

## 2014-06-16 MED ORDER — WARFARIN - PHARMACIST DOSING INPATIENT: Freq: Every day | Status: DC

## 2014-06-16 MED ORDER — ENSURE COMPLETE PO LIQD
237.0000 mL | ORAL | Status: DC
  Administered 2014-06-16 – 2014-06-19 (×4): 237 mL via ORAL

## 2014-06-16 NOTE — Progress Notes (Signed)
[facility] PROGRESS NOTE  Dale HeadlandWilliam G Gamel Jr. ZOX:[id number] DOB: 12-09-1923 DOA: 06/15/2014 PCP: Dale HillierLITTLE,KEVIN LORNE, Whitehead  Assessment/Plan: Acute Metabolic Encephalopathy -Likely from dehydration, mild hypernatremia and ?acute on CKD. -Still very lethargic today. -Start IVF. -CT head without acute issues. -No evidence of PNA/UTI. -Hold torsemide and metolazone.  HTN -Well controlled  H/o DVT -Coumadin as dosed per pharmacy. -Will need to discuss risk/benefit of coumadin with family (I believe he is a poor candidate given age/fall risk and comorbidities).  Acute on CKD Stage III -Baseline Cr around 2.28. -Hydrate and reassess renal function in am. -Follows OP with Dale Whitehead  Right Humeral Fracture -To see ortho next week. -Doubt good OR candidate.   Code Status: Full Code Family Communication: Discussed with daughter and sister Dale Whitehead (caregiver) at bedside.  Disposition Plan: Suspect will need SNF on DC. PT eval once more alert and able to participate.   Consultants:  None   Antibiotics:  None   Subjective: Lethargic  Objective: Filed Vitals:   06/15/14 1754 06/15/14 1854 06/15/14 2032 06/16/14 0457  BP: 145/95 115/98 123/82 129/62  Pulse: 100 96 96 108  Temp: 98.6 F (37 C) 97.5 F (36.4 C) 97.7 F (36.5 C) 98.7 F (37.1 C)  TempSrc: Oral Oral Oral Oral  Resp: 13 16 20 20   Height:   5\' 1"  (1.549 m)   Weight:   84.2 kg (185 lb 10 oz) 84.3 kg (185 lb 13.6 oz)  SpO2: 100% 100% 98% 98%    Intake/Output Summary (Last 24 hours) at 06/16/14 1417 Last data filed at 06/16/14 0500  Gross per 24 hour  Intake      0 ml  Output    765 ml  Net   -765 ml   Filed Weights   06/15/14 2032 06/16/14 0457  Weight: 84.2 kg (185 lb 10 oz) 84.3 kg (185 lb 13.6 oz)    Exam:   General:  lethargic  Cardiovascular: RRR  Respiratory: CTA B  Abdomen: obese/S/NT/ND/+BS  Extremities: trace bilateral edema, right arm in sling.   Neurologic:   Unable to assess given current mental state.  Data Reviewed: Basic Metabolic Panel:  Recent Labs Lab 06/15/14 1447 06/15/14 2104 06/16/14 0425  NA 139  --  140  K 4.5  --  3.7  CL 95*  --  94*  CO2 25  --  26  GLUCOSE 135*  --  138*  BUN 138*  --  133*  CREATININE 3.52*  --  3.28*  CALCIUM 9.8  --  9.5  MG  --  3.9*  --   PHOS  --  4.3  --    Liver Function Tests:  Recent Labs Lab 06/15/14 1447 06/16/14 0425  AST 50* 48*  ALT 53 54*  ALKPHOS 143* 134*  BILITOT <0.2* 0.2*  PROT 8.1 7.7  ALBUMIN 2.9* 2.9*   No results for input(s): LIPASE, AMYLASE in the last 168 hours. No results for input(s): AMMONIA in the last 168 hours. CBC:  Recent Labs Lab 06/15/14 1447 06/16/14 0425  WBC 12.9* 12.8*  HGB 13.0 12.1*  HCT 39.3 37.6*  MCV 93.3 94.0  PLT 294 279   Cardiac Enzymes: No results for input(s): CKTOTAL, CKMB, CKMBINDEX, TROPONINI in the last 168 hours. BNP (last 3 results) No results for input(s): PROBNP in the last 8760 hours. CBG:  Recent Labs Lab 06/16/14 0735  GLUCAP 170*    No results found for this or any previous visit (from the past  240 hour(s)).   Studies: Dg Chest 2 View  06/15/2014   CLINICAL DATA:  Aggressive behavior and confusion since 06/11/2014, weakness, shortness of breath, on home oxygen, dementia, history CHF, hypertension, coronary disease post MI and CABG  EXAM: CHEST  2 VIEW  COMPARISON:  04/20/2014  FINDINGS: Enlargement of cardiac silhouette post median sternotomy.  Stable mediastinal contours.  Normal pulmonary vascularity.  Decreased lung volumes with crowding of perihilar markings and minimal basilar atelectasis.  No acute infiltrate, pleural effusion or pneumothorax.  Post LEFT shoulder replacement.  Subacute fracture proximal RIGHT humerus again noted.  IMPRESSION: Low lung volumes with bibasilar atelectasis.  Enlargement of cardiac silhouette post median sternotomy.   Electronically Signed   By: Ulyses SouthwardMark  Boles M.D.   On:  06/15/2014 17:08   Ct Head Wo Contrast  06/15/2014   CLINICAL DATA:  80 year old male with dementia with aggressive behavior and confusion since 06/19/2014. Initial encounter.  EXAM: CT HEAD WITHOUT CONTRAST  TECHNIQUE: Contiguous axial images were obtained from the base of the skull through the vertex without intravenous contrast.  COMPARISON:  04/19/2014.  FINDINGS: No intracranial hemorrhage.  Small vessel disease type changes without CT evidence of large acute infarct.  Atrophy. Ventricular prominence stable and may reflect central atrophy although mild stable hydrocephalus may also be present.  No intracranial mass lesion noted on this unenhanced exam.  Vascular calcifications.  Opacification left sphenoid sinus.  IMPRESSION: No intracranial hemorrhage.  Small vessel disease type changes without CT evidence of large acute infarct.  Atrophy. Ventricular prominence stable and may reflect central atrophy although mild stable hydrocephalus may also be present.  Opacification left sphenoid sinus.   Electronically Signed   By: Bridgett LarssonSteve  Olson M.D.   On: 06/15/2014 17:37    Scheduled Meds: . docusate sodium  300 mg Oral QHS  . feeding supplement (ENSURE COMPLETE)  237 mL Oral Q24H  . feeding supplement (ENSURE)  1 Container Oral Q24H  . [START ON 06/17/2014] metolazone  2.5 mg Oral QODAY  . mirtazapine  15 mg Oral QHS  . multivitamin with minerals  1 tablet Oral Daily  . polyethylene glycol  17 g Oral Daily  . sodium chloride  3 mL Intravenous Q12H  . tamsulosin  0.4 mg Oral Daily  . [START ON 06/22/2014] Vitamin D (Ergocalciferol)  50,000 Units Oral Q7 days   Continuous Infusions: . sodium chloride 75 mL/hr at 06/16/14 1152    Principal Problem:   Acute encephalopathy Active Problems:   HTN (hypertension)   CAD (coronary artery disease) status post CABG 1985   History of DVT (deep vein thrombosis), recurrent   CKD (chronic kidney disease) stage 3, GFR 30-59 ml/min   Right humeral fracture    Malnutrition of moderate degree   ARF (acute renal failure)    Time spent: 35 minutes. Greater than 50% of this time was spent in direct contact with the patient coordinating care.    Chaya JanHERNANDEZ Whitehead  [facility] Pager [phone]  If 7PM-7AM, please contact night-coverage at [email or web address], password [email or web address] 06/16/2014, 2:17 PM  LOS: 1 day

## 2014-06-16 NOTE — Plan of Care (Signed)
Problem: Phase I Progression Outcomes Goal: Hemodynamically stable Outcome: Completed/Met Date Met:  06/16/14     

## 2014-06-16 NOTE — Plan of Care (Signed)
Problem: Phase I Progression Outcomes Goal: Pain controlled with appropriate interventions Outcome: Completed/Met Date Met:  06/16/14  Comments:  Oral analgesics have been effective at reducing pain

## 2014-06-16 NOTE — Plan of Care (Signed)
Problem: Phase I Progression Outcomes Goal: Voiding-avoid urinary catheter unless indicated Outcome: Completed/Met Date Met:  06/16/14     

## 2014-06-16 NOTE — Progress Notes (Addendum)
Wounds prior to admission: Skin breakdown on right shin- covered will allovyn. Heels red, but blanchable- covered with allovyn. Bruising on left arm. Skin breakdown on bottom-covered with allovyn.  Right shoulder fx. Right thumb fx. Fusion in back.    Will continue to do q2 hours turns, and elevate heels.

## 2014-06-16 NOTE — Progress Notes (Addendum)
INITIAL NUTRITION ASSESSMENT  DOCUMENTATION CODES Per approved criteria  -Moderate malnutrition in the context of acute illness or injury  Pt meets criteria for moderate MALNUTRITION in the context of acute illness as evidenced by PO intake <75% for > 7 days, 14% body weight loss in < 3 months .   INTERVENTION: -Recommend Ensure Complete once daily -Recommend Ensure pudding once daily -Encouraged intake of snacks available on unit -RD to continue to monitor  NUTRITION DIAGNOSIS: Inadequate oral intake related to confusion/decreased appetite as evidenced by PO intake <75% for one week.   Goal: Pt to meet >/= 90% of their estimated nutrition needs    Monitor:  Total protein/energy intake, labs,weights, skin integrity  Reason for Assessment: MST  80 y.o. male  Admitting Dx: Acute encephalopathy  ASSESSMENT: 80 year old male with past medical history of dementia, hypertension, CXD stage 3, CAD status post CABG, DVT on anticoagulation with coumadin who [facility] with more altered mental status, more aggressive behavior and confusion for past few days prior to this admission.  -Pt's daughter reported pt with decreased appetite for one week pta. Family assists in preparing three meals/day for pt; however pt with <50% PO intake of each meal. Consumed one Ensure supplement daily -Daughter endorsed general decline and weight loss since pt fell 6 weeks ago. Did not eat well at rehab facility d/t general dislike of foods available -Previous medical records indicate pt with approximately 30 lb weight loss in past 2 months (14% body weight loss, severe for time frame) -Pt continues to be confused with minimal intake, consuming <50% of meals. Daughter denied dysphagia or difficulty tolerating regular diet textures -Reviewed snack and supplement options. Daughter noted pt would likely consume Ensure pudding and/or crackers w/peanut butter. Encouraged pt's family to  ask for unit snacks as needed -Pt with areas of skin breakdown and red blanchable areas. Encourage supplement and adequate protein intake for skin integrity and prevent further breakdown  Height: Ht Readings from Last 1 Encounters:  06/15/14 5\' 1"  (1.549 m)  Consider obtaining new height  Weight: Wt Readings from Last 1 Encounters:  06/16/14 185 lb 13.6 oz (84.3 kg)    Ideal Body Weight: 112 lb  % Ideal Body Weight: 165%  Wt Readings from Last 10 Encounters:  06/16/14 185 lb 13.6 oz (84.3 kg)  04/20/14 217 lb 14.8 oz (98.85 kg)  08/26/13 198 lb (89.812 kg)  08/21/13 195 lb (88.451 kg)  02/05/12 160 lb (72.576 kg)  01/10/12 160 lb (72.576 kg)  12/31/07 159 lb 2.1 oz (72.181 kg)    Usual Body Weight: family unsure; 217 lb in 03/2014  % Usual Body Weight: 85%  BMI:  Body mass index is 35.13 kg/(m^2).  Estimated Nutritional Needs: Kcal: 1650-1850 Protein: 70-85 gram Fluid: >/= 1700 ml daily  Skin: skin breakdown on bottom, red blanchable heels   Diet Order: Diet regular  EDUCATION NEEDS: -Education needs addressed   Intake/Output Summary (Last 24 hours) at 06/16/14 1015 Last data filed at 06/16/14 0500  Gross per 24 hour  Intake      0 ml  Output    765 ml  Net   -765 ml    Last BM: WDL  Labs:   Recent Labs Lab 06/15/14 1447 06/15/14 2104 06/16/14 0425  NA 139  --  140  K 4.5  --  3.7  CL 95*  --  94*  CO2 25  --  26  BUN 138*  --  133*  CREATININE 3.52*  --  3.28*  CALCIUM 9.8  --  9.5  MG  --  3.9*  --   PHOS  --  4.3  --   GLUCOSE 135*  --  138*    CBG (last 3)   Recent Labs  06/16/14 0735  GLUCAP 170*    Scheduled Meds: . docusate sodium  300 mg Oral QHS  . [START ON 06/17/2014] metolazone  2.5 mg Oral QODAY  . mirtazapine  15 mg Oral QHS  . multivitamin with minerals  1 tablet Oral Daily  . polyethylene glycol  17 g Oral Daily  . sodium chloride  3 mL Intravenous Q12H  . tamsulosin  0.4 mg Oral Daily  . [START ON 06/22/2014]  Vitamin D (Ergocalciferol)  50,000 Units Oral Q7 days    Continuous Infusions: . sodium chloride      Past Medical History  Diagnosis Date  . HLD (hyperlipidemia)   . HTN (hypertension)   . Sleep apnea   . CAD (coronary artery disease)   . DVT (deep venous thrombosis)     right leg  . Bilateral lower extremity edema     chronic  . Right HF (heart failure)   . S/P CABG (coronary artery bypass graft) 1985  . CHF (congestive heart failure)   . Family history of anesthesia complication     daughter has nausea  . Myocardial infarction   . Heart murmur   . Shortness of breath   . Depression   . Renal insufficiency   . GERD (gastroesophageal reflux disease)   . Arthritis   . Enlarged prostate   . Dementia     Past Surgical History  Procedure Laterality Date  . Coronary artery bypass graft  1985  . Cataract extraction, bilateral    . Back surgery    . Appendectomy    . Cardiac catheterization      Lloyd HugerSarah F Jaequan Propes MS RD LDN Clinical Dietitian Pager:[phone]

## 2014-06-16 NOTE — Care Management Note (Addendum)
    Page 1 of 1   06/19/2014     7:56:34 PM CARE MANAGEMENT NOTE 06/19/2014  Patient:  Dale Whitehead,Dale Whitehead   Account Number:  [id number]  Date Initiated:  06/16/2014  Documentation initiated by:  Lanier ClamMAHABIR,Genessa Beman  Subjective/Objective Assessment:   80 Y/O M ADMITTED W/ACUTE ENCEPHALOPATHY.     Action/Plan:   FROM HOME.HAS ALL DME.ACTIVE W/[facility] HHRN/PT/OT.   Anticipated DC Date:  06/22/2014   Anticipated DC Plan:  HOME W HOME HEALTH SERVICES      DC Planning Services  CM consult      Choice offered to / List presented to:             Status of service:  In process, will continue to follow Medicare Important Message given?  YES (If response is "NO", the following Medicare IM given date fields will be blank) Date Medicare IM given:  06/19/2014 Medicare IM given by:  [facility]Rondell Frick Date Additional Medicare IM given:   Additional Medicare IM given by:    Discharge Disposition:    Per UR Regulation:  Reviewed for med. necessity/level of care/duration of stay  If discussed at Long Length of Stay Meetings, dates discussed:    Comments:  06/19/14 Lyla Jasek RN,BSN NCM [phone] SPOKE TO DTR HCPOA,SHE WANTS HOME W/HOSPICE SERVICES W/[facility].SHE STATES SHE HAS HAD [facility] PAST,BUT WHEN I CONTACTED THEM-THEY ARE NO LONGER ON SERVICE & THEY HAD ALREADY TAKEN ALL OF THEIR DME BACK.[facility] IS CURRENTLY ACTIVE W/PATIENT.WILL AWAIT CONS TO OFFER HOME HOSPICE CHOICE.MD NOTIFIED WHO IS WAITING FOR PALLIATIVE TO CONS.DTR STTES SHE HAS DME IN THE HOME-HOSPITAL BED,02,OVERBED TABLE,RW,ROLLATOR,HOYER LIFT.  06/16/14 Shirl Ludington RN,BSN NCM [phone] [facility] KRISTEN ALREADY FOLLOWING.AWAIT PT RECOMMENDATIONS.AMBULANCE TRANSP NEEDED @ D/C.CSW NOTIFIED.

## 2014-06-16 NOTE — Progress Notes (Signed)
ANTICOAGULATION CONSULT NOTE - Initial Consult  Pharmacy Consult for warfarin Indication: history of DVT  Allergies  Allergen Reactions  . Accupril [Quinapril Hcl] Cough  . Erythromycin Other (See Comments)    Very bad burning  . Niacin     REACTION: itching  . Paxil [Paroxetine Hcl] Other (See Comments)    unknown  . Statins Other (See Comments)    tremors   . Tramadol-Acetaminophen Rash    Patient Measurements: Height: 5\' 1"  (154.9 cm) Weight: 185 lb 13.6 oz (84.3 kg) IBW/kg (Calculated) : 52.3 Heparin Dosing Weight:   Vital Signs: Temp: 98.7 F (37.1 C) (11/17 0457) Temp Source: Oral (11/17 0457) BP: 129/62 mmHg (11/17 0457) Pulse Rate: 108 (11/17 0457)  Labs:  Recent Labs  06/15/14 1447 06/16/14 0425  HGB 13.0 12.1*  HCT 39.3 37.6*  PLT 294 279  LABPROT 38.4* 36.3*  INR 3.88* 3.62*  CREATININE 3.52* 3.28*    Estimated Creatinine Clearance: 14.3 mL/min (by C-G formula based on Cr of 3.28).   Medical History: Past Medical History  Diagnosis Date  . HLD (hyperlipidemia)   . HTN (hypertension)   . Sleep apnea   . CAD (coronary artery disease)   . DVT (deep venous thrombosis)     right leg  . Bilateral lower extremity edema     chronic  . Right HF (heart failure)   . S/P CABG (coronary artery bypass graft) 1985  . CHF (congestive heart failure)   . Family history of anesthesia complication     daughter has nausea  . Myocardial infarction   . Heart murmur   . Shortness of breath   . Depression   . Renal insufficiency   . GERD (gastroesophageal reflux disease)   . Arthritis   . Enlarged prostate   . Dementia     Assessment: [age] YOM presents with mental status changes and lethargy likely multifactorial.  On warfarin for h/o DVT.  INR supratherapeutic at time of admission, pharmacy asked to assist with dosing warfarin inpatient.   Home dose: warfarin 2.5mg  MWF and 3.5mg  TTSS (last dose 11/15)  11/17:  INR = 3.62  CBC: Hbg = 12.1 and pltc  WNL  Diet: regular - suspect not eating much d/t lethargy  No major drug interactions  Goal of Therapy:  INR 2-3 Monitor platelets by anticoagulation protocol: Yes   Plan:   No warfarin tonight for supratherapeutic INR  Daily INR  Juliette Alcideustin Zeigler, PharmD, BCPS.   Pager: [phone]  06/16/2014,2:31 PM

## 2014-06-17 ENCOUNTER — Inpatient Hospital Stay (HOSPITAL_COMMUNITY): Payer: Medicare Other

## 2014-06-17 DIAGNOSIS — S42301S Unspecified fracture of shaft of humerus, right arm, sequela: Secondary | ICD-10-CM

## 2014-06-17 LAB — BASIC METABOLIC PANEL
Anion gap: 17 — ABNORMAL HIGH (ref 5–15)
BUN: 111 mg/dL — ABNORMAL HIGH (ref 6–23)
CHLORIDE: 95 meq/L — AB (ref 96–112)
CO2: 24 meq/L (ref 19–32)
CREATININE: 2.32 mg/dL — AB (ref 0.50–1.35)
Calcium: 8.8 mg/dL (ref 8.4–10.5)
GFR calc Af Amer: 27 mL/min — ABNORMAL LOW (ref >90)
GFR calc non Af Amer: 23 mL/min — ABNORMAL LOW (ref >90)
GLUCOSE: 102 mg/dL — AB (ref 70–99)
Potassium: 3.7 mEq/L (ref 3.7–5.3)
SODIUM: 136 meq/L — AB (ref 137–147)

## 2014-06-17 LAB — CBC
HEMATOCRIT: 37 % — AB (ref 39.0–52.0)
HEMOGLOBIN: 12.1 g/dL — AB (ref 13.0–17.0)
MCH: 30.9 pg (ref 26.0–34.0)
MCHC: 32.7 g/dL (ref 30.0–36.0)
MCV: 94.4 fL (ref 78.0–100.0)
Platelets: 255 10*3/uL (ref 150–400)
RBC: 3.92 MIL/uL — ABNORMAL LOW (ref 4.22–5.81)
RDW: 13.4 % (ref 11.5–15.5)
WBC: 13.6 10*3/uL — AB (ref 4.0–10.5)

## 2014-06-17 LAB — GLUCOSE, CAPILLARY: Glucose-Capillary: 112 mg/dL — ABNORMAL HIGH (ref 70–99)

## 2014-06-17 LAB — PROTIME-INR
INR: 2.99 — ABNORMAL HIGH (ref 0.00–1.49)
Prothrombin Time: 31.3 seconds — ABNORMAL HIGH (ref 11.6–15.2)

## 2014-06-17 MED ORDER — WARFARIN SODIUM 2.5 MG PO TABS
2.5000 mg | ORAL_TABLET | Freq: Once | ORAL | Status: AC
  Administered 2014-06-17: 2.5 mg via ORAL
  Filled 2014-06-17: qty 1

## 2014-06-17 NOTE — Consult Note (Signed)
Renal Service Consult Note [facility]  Britton Bera 06/17/2014 Roney Jaffe D Requesting Physician:  Dr Grandville Silos  Reason for Consult:  Acute on CRF HPI: The patient is a 80 y.o. year-old w hx of CAD / CABG, HTN, recent dx of dementia presented to ED with c/o aggressive behavior and confusion since Nov 12th at [facility].  On home O2 at 4L per [place].  Nonambulatory on presentation, preexisting shoulder injury from September.  Arm was in sling.  Pt was admitted by [facility] for acute encephalopathy on chronic dementia, HTN, hx CAD, CDK baseline creat 1.8 - 2.3 now with creat 3.52 on admission   Chart review: 12/04 - left comminuted prox humerus fracture, underwent L shoulder hemiarthroplasty; R fem DVT, HTN, HL, depression, anemia  9/09 - bilat LE DVT, HTN, hx CABG, OSA on CPAP, HL, BPH, chronic low back pain 8/10 - kyphoplasty, hx prior DVT, HTN, L4 compression fracture with low back pain, CKD, insomnia, depression, resolved hypoNa 9/15 - 04/20/14 > fall resulting in R humeral neck fracture and R wrist fracture ; admitted and seen by ortho, recommended rest for 4-5 wks then gentle progressive PT. If does not heal then total shoulder should be considered.    ROS  not providing any history  Past Medical History  Past Medical History  Diagnosis Date  . HLD (hyperlipidemia)   . HTN (hypertension)   . Sleep apnea   . CAD (coronary artery disease)   . DVT (deep venous thrombosis)     right leg  . Bilateral lower extremity edema     chronic  . Right HF (heart failure)   . S/P CABG (coronary artery bypass graft) 1985  . CHF (congestive heart failure)   . Family history of anesthesia complication     daughter has nausea  . Myocardial infarction   . Heart murmur   . Shortness of breath   . Depression   . Renal insufficiency   . GERD (gastroesophageal reflux disease)   . Arthritis   . Enlarged prostate   . Dementia    Past Surgical History  Past Surgical History   Procedure Laterality Date  . Coronary artery bypass graft  1985  . Cataract extraction, bilateral    . Back surgery    . Appendectomy    . Cardiac catheterization     Family History  Family History  Problem Relation Age of Onset  . Heart disease Father   . Heart disease Son   . Heart disease Paternal Grandfather    Social History  reports that he has never smoked. He has never used smokeless tobacco. He reports that he does not drink alcohol or use illicit drugs. Allergies  Allergies  Allergen Reactions  . Accupril [Quinapril Hcl] Cough  . Erythromycin Other (See Comments)    Very bad burning  . Niacin     REACTION: itching  . Paxil [Paroxetine Hcl] Other (See Comments)    unknown  . Statins Other (See Comments)    tremors   . Tramadol-Acetaminophen Rash   Home medications Prior to Admission medications   Medication Sig Start Date End Date Taking? Authorizing Provider  acetaminophen (TYLENOL) 500 MG tablet Take 500-1,000 mg by mouth every 6 (six) hours as needed for mild pain.   Yes Historical Provider, MD  ALPRAZolam (XANAX) 0.25 MG tablet Take 0.25 mg by mouth 2 (two) times daily as needed for anxiety.   Yes Historical Provider, MD  clotrimazole (LOTRIMIN) 1 % cream  Apply 1 application topically daily as needed (yeast).  07/18/13  Yes Historical Provider, MD  docusate sodium (COLACE) 100 MG capsule Take 300 mg by mouth at bedtime.   Yes Historical Provider, MD  famotidine (PEPCID) 10 MG tablet Take 10 mg by mouth daily as needed for heartburn.    Yes Historical Provider, MD  losartan (COZAAR) 50 MG tablet TAKE 1 TABLET BY MOUTH DAILY   Yes Mihai Croitoru, MD  metolazone (ZAROXOLYN) 2.5 MG tablet Take 2.5 mg by mouth every other day.   Yes Historical Provider, MD  mirtazapine (REMERON) 15 MG tablet Take 15 mg by mouth at bedtime.   Yes Historical Provider, MD  Multiple Vitamin (MULTIVITAMIN) tablet Take 1 tablet by mouth daily.   Yes Historical Provider, MD  mupirocin  ointment (BACTROBAN) 2 % Place 1 application into the nose daily. As needed   Yes Historical Provider, MD  oxyCODONE-acetaminophen (PERCOCET/ROXICET) 5-325 MG per tablet Take 1-2 tablets by mouth every 4 (four) hours as needed for moderate pain. 04/20/14  Yes Donne Hazel, MD  Polyethyl Glycol-Propyl Glycol (SYSTANE OP) Apply 1 drop to eye daily as needed (dry/irritated eyes).   Yes Historical Provider, MD  polyethylene glycol (MIRALAX / GLYCOLAX) packet Take 17 g by mouth daily. Patient taking differently: Take 17 g by mouth daily as needed for mild constipation or moderate constipation.  04/20/14  Yes Donne Hazel, MD  potassium chloride (K-DUR,KLOR-CON) 10 MEQ tablet Take 3 tablets (30 mEq total) by mouth daily. 11/05/13  Yes Mihai Croitoru, MD  silodosin (RAPAFLO) 8 MG CAPS capsule Take 8 mg by mouth daily with breakfast.   Yes Historical Provider, MD  torsemide (DEMADEX) 20 MG tablet TAKE 1 TABLET BY MOUTH DAILY AS NEEDED 06/05/14  Yes Mihai Croitoru, MD  Vitamin D, Ergocalciferol, (DRISDOL) 50000 UNITS CAPS capsule Take 50,000 Units by mouth every 7 (seven) days. Every monday   Yes Historical Provider, MD  warfarin (COUMADIN) 1 MG tablet Take 1 mg by mouth daily. Every Saturday, Sunday, Tuesday, and Thursday take 3.5 (combination of warfarin 2.64m and warfarin 162m Every Monday, Wednesday, and Friday take 2.14m2m  Yes Historical Provider, MD  warfarin (COUMADIN) 2.5 MG tablet Take 2.5 mg by mouth daily. Every Monday, Wednesday, and Friday take 2.5 Every Saturday, Sunday, Tuesday, and Thursday take 3.5 (combination of warfarin 2.14mg4md warfarin 1mg)83mYes Historical Provider, MD  HYDROcodone-acetaminophen (NORCO/VICODIN) 5-325 MG per tablet Take 1 tablet by mouth 3 (three) times daily as needed for moderate pain or severe pain.    Historical Provider, MD  warfarin (COUMADIN) 4 MG tablet Take 4-6 mg by mouth every evening. Take 4mg e614myday except Wednesday take 6mg.  82mistorical Provider, MD    Liver Function Tests  Recent Labs Lab 06/15/14 1447 06/16/14 0425  AST 50* 48*  ALT 53 54*  ALKPHOS 143* 134*  BILITOT <0.2* 0.2*  PROT 8.1 7.7  ALBUMIN 2.9* 2.9*   No results for input(s): LIPASE, AMYLASE in the last 168 hours. CBC  Recent Labs Lab 06/15/14 1447 06/16/14 0425 06/17/14 0535  WBC 12.9* 12.8* 13.6*  HGB 13.0 12.1* 12.1*  HCT 39.3 37.6* 37.0*  MCV 93.3 94.0 94.4  PLT 294 279 255   B409c Metabolic Panel  Recent Labs Lab 06/15/14 1447 06/15/14 2104 06/16/14 0425 06/17/14 0535  NA 139  --  140 136*  K 4.5  --  3.7 3.7  CL 95*  --  94* 95*  CO2 25  --  26 24  GLUCOSE 135*  --  138* 102*  BUN 138*  --  133* 111*  CREATININE 3.52*  --  3.28* 2.32*  CALCIUM 9.8  --  9.5 8.8  PHOS  --  4.3  --   --     Filed Vitals:   06/17/14 0002 06/17/14 0038 06/17/14 0515 06/17/14 1330  BP: 139/70  157/76 125/56  Pulse: 103  97 95  Temp: 98.4 F (36.9 C)  98.5 F (36.9 C) 98.6 F (37 C)  TempSrc: Axillary  Axillary Axillary  Resp: 20  20 18   Height:      Weight:  83.7 kg (184 lb 8.4 oz)    SpO2: 99%  99% 98%   Exam: Lethargic, awakens and will answer simple questions, very tired appearing and chronically ill No rash, cyanosis or gangrene Sclera anicteric, throat clear No jvd Chest is clear bilat, marginal resp effort RRR no MRG heard Abd soft, obese, nondistended, +BS Mild bilat LE edema of lower legs primarily Mild UE edema bilat Neuro mild jerking of extremities, somnolent, gen diffuse weaknes Confused  UA 11/16 negative BUN 138/3.52 yesterday, down to 111/2.32 today Baseline creat 1.8 - 2.3 between Jan and Sept 2015, baseline eGFR 23 - 30 CXR 11/16 no CHF, bibasilar atx, CM UOP 1195 yesterday Home BP meds/ diuretics - losartan, zaroxyln, torsemide  Assessment: 1 Acute on CKD - prob due to ARB/ hypotension / vol depletion. UA negative. Creat improving with IVF"s. Would continue current management.  BP's stable to high.   2 AMS - prob  uremic 3 Dementia 4 R shoulder fracture - sept 2015, conservative mgmnt 5 HTN 6 CAD hx CABG 7 Hx bilat LE DVT 8 Hx depression    Plan- cont IVF's.  May be a poor candidate for RRT given comorbidities.  Recommend conservative care for now. Will d/w his primary nephrologist as well.   Kelly Splinter MD (pgr) [phone]    [phone] 06/17/2014, 3:54 PM

## 2014-06-17 NOTE — Plan of Care (Signed)
Problem: Phase I Progression Outcomes Goal: Other Phase I Outcomes/Goals Outcome: Completed/Met Date Met:  06/17/14 Patient remains afebrile

## 2014-06-17 NOTE — Evaluation (Signed)
Physical Therapy Evaluation Patient Details Name: Dale HeadlandWilliam G Anker Whitehead. MRN: [id number] DOB: [birth year] Today's Date: 06/17/2014   History of Present Illness  80 yo with recent hospitalization in Sept  with R humeral and R scaphoid fracture and increased confusion. After last hopsitalization , pt [facility] and then discharged home. Prior to the Sept admission pt was able to transfer with help and use RW (rollator) for short bouts.    Clinical Impression  Pt very lethargic during session today, dtr seems to thin it may be due to the pain meds. Feel pt would benefit from continued PT in order to regain at least sitting balance , and assist with bed mobility, with eventual goal of transfers with the dtr assisting Min to Mod. Pt's family prefers to DC home at this time with her 24 care/supervision and home health services. I discussed SNF as an option and that it is recommended, and dtr is aware.       Follow Up Recommendations Home health PT (recommend SNF , however [facility] is aware and [facility] them as well. )    Equipment Recommendations  None recommended by PT    Recommendations for Other Services       Precautions / Restrictions Precautions Precaution Comments: sling RUE and wrist/thumb splint Required Braces or Orthoses: Sling;Other Brace/Splint Other Brace/Splint: wrist/thumb      Mobility  Bed Mobility Overal bed mobility: Needs Assistance Bed Mobility: Supine to Sit;Sit to Supine     Supine to sit: Max assist Sit to supine: +2 for physical assistance;Max assist   General bed mobility comments: pt assisted very little, maybe some in trunk for stability  Transfers Overall transfer level: Needs assistance               General transfer comment: just performed sitting EOB for about 15 minutes working on staying alert, needed constant cues and assistance to balance. Limited  with UE use due to mit on Left hand and R arm unable to use due to fracture and in sling. Did have a few righiting reactions in sitting and used LUE at times to prop up with modA.   Ambulation/Gait                Stairs            Wheelchair Mobility    Modified Rankin (Stroke Patients Only)       Balance Overall balance assessment: Needs assistance Sitting-balance support: Single extremity supported Sitting balance-Leahy Scale: Poor Sitting balance - Comments: sat for 15 min with varied total Assist to mod A Postural control: Left lateral lean                                   Pertinent Vitals/Pain Pain Assessment:  (pt could not state pain, however did not have grimaces with sitting and rolling )    Home Living Family/patient expects to be discharged to:: Private residence Living Arrangements: Children (daughter) Available Help at Discharge: Family;Available 24 hours/day Type of Home: House Home Access: Ramped entrance     Home Layout: One level Home Equipment: Wheelchair - [employer] Corporationmanual;Walker - 4 wheels;Hospital bed;Tub bench;Other (comment) (lift chari)      Prior Function Level of Independence: Needs assistance   Gait / Transfers Assistance Needed: uses lift chair to  stand with rollator, [facility], and some little walking with rollator prior to September  ADL's / Homemaking Assistance Needed: daughter helps to dress and sponge bathe; pt can assist with UB        Hand Dominance        Extremity/Trunk Assessment               Lower Extremity Assessment: Generalized weakness (very weak, unable to test due to pt unable to follow those kinds of commnads today. )         Communication   Communication: Expressive difficulties (pt did not communicate much during session, few one word answers but was very lethargic per daughter)  Cognition Arousal/Alertness: Lethargic (had to continue to stimulate for  alertness during session, even at EOB)   Overall Cognitive Status: Impaired/Different from baseline Area of Impairment:  (oriented to self and stated decessed wife's name as well. Was not able to state dtr's name in the room)                    General Comments      Exercises        Assessment/Plan    PT Assessment Patient needs continued PT services  PT Diagnosis Generalized weakness   PT Problem List Decreased strength;Decreased activity tolerance;Decreased balance;Decreased mobility  PT Treatment Interventions Functional mobility training;Therapeutic activities;Therapeutic exercise;Patient/family education   PT Goals (Current goals can be found in the Care Plan section) Acute Rehab PT Goals Patient Stated Goal: unable to state PT Goal Formulation: With patient/family Time For Goal Achievement: 07/01/14 Potential to Achieve Goals: Fair    Frequency Min 3X/week   Barriers to discharge        Co-evaluation               End of Session   Activity Tolerance: Patient limited by lethargy (fatigued easily , however this was a good session for him. ) Patient left: in bed;with call bell/phone within reach;with bed alarm set;with nursing/sitter in room;with family/visitor present (dtr) Nurse Communication: Mobility status;Need for lift equipment         Time: 1145-1230 PT Time Calculation (min) (ACUTE ONLY): 45 min   Charges:   PT Evaluation $Initial PT Evaluation Tier I: 1 Procedure PT Treatments $Therapeutic Activity: 23-37 mins   PT G Codes:          [facility] 06/17/2014, 2:29 PM  [facility], PT Pager: [phone] 06/17/2014

## 2014-06-17 NOTE — Plan of Care (Signed)
Problem: Consults Goal: General Medical Patient Education See Patient Education Module for specific education.  Outcome: Completed/Met Date Met:  06/17/14

## 2014-06-17 NOTE — Progress Notes (Signed)
ANTICOAGULATION CONSULT NOTE - Follow-up  Pharmacy Consult for warfarin Indication: history of DVT  Allergies  Allergen Reactions  . Accupril [Quinapril Hcl] Cough  . Erythromycin Other (See Comments)    Very bad burning  . Niacin     REACTION: itching  . Paxil [Paroxetine Hcl] Other (See Comments)    unknown  . Statins Other (See Comments)    tremors   . Tramadol-Acetaminophen Rash    Patient Measurements: Height: 5\' 1"  (154.9 cm) Weight: 184 lb 8.4 oz (83.7 kg) IBW/kg (Calculated) : 52.3 Heparin Dosing Weight:   Vital Signs: Temp: 98.5 F (36.9 C) (11/18 0515) Temp Source: Axillary (11/18 0515) BP: 157/76 mmHg (11/18 0515) Pulse Rate: 97 (11/18 0515)  Labs:  Recent Labs  06/15/14 1447 06/16/14 0425 06/17/14 0535  HGB 13.0 12.1* 12.1*  HCT 39.3 37.6* 37.0*  PLT 294 279 255  LABPROT 38.4* 36.3* 31.3*  INR 3.88* 3.62* 2.99*  CREATININE 3.52* 3.28* 2.32*    Estimated Creatinine Clearance: 20.2 mL/min (by C-G formula based on Cr of 2.32).  Assessment: [age] YOM presents with mental status changes and lethargy likely multifactorial.  On warfarin for h/o DVT.  INR supratherapeutic at time of admission, pharmacy asked to assist with dosing warfarin inpatient. Physician notes concern of possible risk of anticoagulation d/t falls/comorbities but appears family wishes to continue warfarin at this time.   Home dose: warfarin 2.5mg  MWF and 3.5mg  TTSS (last dose 11/15)  11/18:  INR = 2.99 - now trending down to within therapeutic range   CBC: Hbg = 12.1/stable and pltc WNL  Diet: regular - suspect not eating much d/t lethargy but documented that ate 75% dinner last pm  No major drug interactions  Goal of Therapy:  INR 2-3 Monitor platelets by anticoagulation protocol: Yes   Plan:   Warfarin 2.5mg  PO x1 tonight as INR has fallen into therapeutic range  Daily INR  Juliette Alcideustin Mclean Moya, PharmD, BCPS.   Pager: [phone]  06/17/2014,8:40 AM

## 2014-06-17 NOTE — Progress Notes (Signed)
[facility] PROGRESS NOTE  Dale HeadlandWilliam G Linam Jr. WUJ:[id number] DOB: 02-25-1924 DOA: 06/15/2014 PCP: Mickie HillierLITTLE,KEVIN LORNE, MD  Assessment/Plan: #1 acute encephalopathy Questionable etiology. Likely multi-factorial in nature secondary to dehydration a mild hyponatremia and acute on chronic kidney disease. Concern for uremia. Patient still lethargic. Head CT negative. Chest x-ray negative for any acute infiltrate. Urinalysis negative for UTI. Torsemide and metolazone on hold. Patient on IV fluids. We'll called with nephrology for further evaluation for possible uremia. Follow.  #2 hypertension Stable. Continue current regimen.  #3 history of DVT Coumadin per pharmacy.  #4 acute on chronic kidney disease stage III Currently close to baseline. Patient's baseline is around 2.28. Continue gentle hydration. Due to concern for uremia will consult with nephrology for further evaluation and management.  #5 right upper extremity humeral fracture Will repeat x-rays of the right shoulder and wrist and will discuss findings with patient's orthopedist. Outpatient follow-up.  #6 prophylaxis Coumadin for DVT prophylaxis.  Code Status: full Family Communication: updated patient and wife at bedside. Disposition Plan: was medical stable may need SNF.   Consultants:  none  Procedures:  CT head 06/15/2014  Chest x-ray 06/15/2014  Antibiotics:  none  HPI/Subjective: Patient drowsy alert to self and place only. Per wife some improvement in patient's mentation.  Objective: Filed Vitals:   06/17/14 0515  BP: 157/76  Pulse: 97  Temp: 98.5 F (36.9 C)  Resp: 20    Intake/Output Summary (Last 24 hours) at 06/17/14 1304 Last data filed at 06/17/14 0600  Gross per 24 hour  Intake   1345 ml  Output    820 ml  Net    525 ml   Filed Weights   06/15/14 2032 06/16/14 0457 06/17/14 0038  Weight: 84.2 kg (185 lb 10 oz) 84.3 kg (185 lb 13.6 oz) 83.7 kg (184 lb 8.4 oz)     Exam:   General:  Drowsy.  Cardiovascular: RRR  Respiratory: CTAB anterior lung fields  Abdomen: soft, nontender, nondistended, positive bowel sounds.  Musculoskeletal: no clubbing cyanosis or edema. Right upper extremity in a sling. Some asterixis on left upper extremity.  Data Reviewed: Basic Metabolic Panel:  Recent Labs Lab 06/15/14 1447 06/15/14 2104 06/16/14 0425 06/17/14 0535  NA 139  --  140 136*  K 4.5  --  3.7 3.7  CL 95*  --  94* 95*  CO2 25  --  26 24  GLUCOSE 135*  --  138* 102*  BUN 138*  --  133* 111*  CREATININE 3.52*  --  3.28* 2.32*  CALCIUM 9.8  --  9.5 8.8  MG  --  3.9*  --   --   PHOS  --  4.3  --   --    Liver Function Tests:  Recent Labs Lab 06/15/14 1447 06/16/14 0425  AST 50* 48*  ALT 53 54*  ALKPHOS 143* 134*  BILITOT <0.2* 0.2*  PROT 8.1 7.7  ALBUMIN 2.9* 2.9*   No results for input(s): LIPASE, AMYLASE in the last 168 hours. No results for input(s): AMMONIA in the last 168 hours. CBC:  Recent Labs Lab 06/15/14 1447 06/16/14 0425 06/17/14 0535  WBC 12.9* 12.8* 13.6*  HGB 13.0 12.1* 12.1*  HCT 39.3 37.6* 37.0*  MCV 93.3 94.0 94.4  PLT 294 279 255   Cardiac Enzymes: No results for input(s): CKTOTAL, CKMB, CKMBINDEX, TROPONINI in the last 168 hours. BNP (last 3 results) No results for input(s): PROBNP in the last 8760 hours. CBG:  Recent Labs Lab  06/16/14 0735 06/17/14 0739  GLUCAP 170* 112*    No results found for this or any previous visit (from the past 240 hour(s)).   Studies: Dg Chest 2 View  06/15/2014   CLINICAL DATA:  Aggressive behavior and confusion since 06/11/2014, weakness, shortness of breath, on home oxygen, dementia, history CHF, hypertension, coronary disease post MI and CABG  EXAM: CHEST  2 VIEW  COMPARISON:  04/20/2014  FINDINGS: Enlargement of cardiac silhouette post median sternotomy.  Stable mediastinal contours.  Normal pulmonary vascularity.  Decreased lung volumes with crowding of  perihilar markings and minimal basilar atelectasis.  No acute infiltrate, pleural effusion or pneumothorax.  Post LEFT shoulder replacement.  Subacute fracture proximal RIGHT humerus again noted.  IMPRESSION: Low lung volumes with bibasilar atelectasis.  Enlargement of cardiac silhouette post median sternotomy.   Electronically Signed   By: Ulyses SouthwardMark  Boles M.D.   On: 06/15/2014 17:08   Ct Head Wo Contrast  06/15/2014   CLINICAL DATA:  80 year old male with dementia with aggressive behavior and confusion since 06/19/2014. Initial encounter.  EXAM: CT HEAD WITHOUT CONTRAST  TECHNIQUE: Contiguous axial images were obtained from the base of the skull through the vertex without intravenous contrast.  COMPARISON:  04/19/2014.  FINDINGS: No intracranial hemorrhage.  Small vessel disease type changes without CT evidence of large acute infarct.  Atrophy. Ventricular prominence stable and may reflect central atrophy although mild stable hydrocephalus may also be present.  No intracranial mass lesion noted on this unenhanced exam.  Vascular calcifications.  Opacification left sphenoid sinus.  IMPRESSION: No intracranial hemorrhage.  Small vessel disease type changes without CT evidence of large acute infarct.  Atrophy. Ventricular prominence stable and may reflect central atrophy although mild stable hydrocephalus may also be present.  Opacification left sphenoid sinus.   Electronically Signed   By: Bridgett LarssonSteve  Olson M.D.   On: 06/15/2014 17:37    Scheduled Meds: . docusate sodium  300 mg Oral QHS  . feeding supplement (ENSURE COMPLETE)  237 mL Oral Q24H  . feeding supplement (ENSURE)  1 Container Oral Q24H  . mirtazapine  15 mg Oral QHS  . multivitamin with minerals  1 tablet Oral Daily  . polyethylene glycol  17 g Oral Daily  . sodium chloride  3 mL Intravenous Q12H  . tamsulosin  0.4 mg Oral Daily  . [START ON 06/22/2014] Vitamin D (Ergocalciferol)  50,000 Units Oral Q7 days  . warfarin  2.5 mg Oral ONCE-1800  .  Warfarin - Pharmacist Dosing Inpatient   Does not apply q1800   Continuous Infusions: . sodium chloride 50 mL/hr at 06/17/14 [id number]    Principal Problem:   Acute encephalopathy Active Problems:   HTN (hypertension)   CAD (coronary artery disease) status post CABG 1985   History of DVT (deep vein thrombosis), recurrent   CKD (chronic kidney disease) stage 3, GFR 30-59 ml/min   Right humeral fracture   Malnutrition of moderate degree   ARF (acute renal failure)    Time spent: 35 MINS    [facility] Wom[facility] HospitalHOMPSON,DANIEL MD [facility] Pager [phone]. If 7PM-7AM, please contact night-coverage at [email or web address], password [email or web address] 06/17/2014, 1:04 PM  LOS: 2 days

## 2014-06-18 DIAGNOSIS — N19 Unspecified kidney failure: Secondary | ICD-10-CM

## 2014-06-18 LAB — CBC WITH DIFFERENTIAL/PLATELET
Basophils Absolute: 0 10*3/uL (ref 0.0–0.1)
Basophils Relative: 0 % (ref 0–1)
Eosinophils Absolute: 0.5 10*3/uL (ref 0.0–0.7)
Eosinophils Relative: 4 % (ref 0–5)
HCT: 38.6 % — ABNORMAL LOW (ref 39.0–52.0)
HEMOGLOBIN: 12.5 g/dL — AB (ref 13.0–17.0)
LYMPHS ABS: 4.4 10*3/uL — AB (ref 0.7–4.0)
LYMPHS PCT: 31 % (ref 12–46)
MCH: 30 pg (ref 26.0–34.0)
MCHC: 32.4 g/dL (ref 30.0–36.0)
MCV: 92.8 fL (ref 78.0–100.0)
Monocytes Absolute: 0.9 10*3/uL (ref 0.1–1.0)
Monocytes Relative: 6 % (ref 3–12)
NEUTROS PCT: 59 % (ref 43–77)
Neutro Abs: 8.1 10*3/uL — ABNORMAL HIGH (ref 1.7–7.7)
Platelets: 266 10*3/uL (ref 150–400)
RBC: 4.16 MIL/uL — AB (ref 4.22–5.81)
RDW: 13.3 % (ref 11.5–15.5)
WBC: 13.9 10*3/uL — ABNORMAL HIGH (ref 4.0–10.5)

## 2014-06-18 LAB — BASIC METABOLIC PANEL
Anion gap: 16 — ABNORMAL HIGH (ref 5–15)
BUN: 68 mg/dL — ABNORMAL HIGH (ref 6–23)
CHLORIDE: 97 meq/L (ref 96–112)
CO2: 25 meq/L (ref 19–32)
Calcium: 8.8 mg/dL (ref 8.4–10.5)
Creatinine, Ser: 1.49 mg/dL — ABNORMAL HIGH (ref 0.50–1.35)
GFR calc non Af Amer: 40 mL/min — ABNORMAL LOW (ref >90)
GFR, EST AFRICAN AMERICAN: 46 mL/min — AB (ref >90)
Glucose, Bld: 197 mg/dL — ABNORMAL HIGH (ref 70–99)
POTASSIUM: 3.5 meq/L — AB (ref 3.7–5.3)
Sodium: 138 mEq/L (ref 137–147)

## 2014-06-18 LAB — GLUCOSE, CAPILLARY: GLUCOSE-CAPILLARY: 121 mg/dL — AB (ref 70–99)

## 2014-06-18 LAB — PROTIME-INR
INR: 2.26 — AB (ref 0.00–1.49)
Prothrombin Time: 25.1 seconds — ABNORMAL HIGH (ref 11.6–15.2)

## 2014-06-18 MED ORDER — SORBITOL 70 % SOLN
30.0000 mL | Freq: Every day | Status: DC | PRN
  Filled 2014-06-18: qty 30

## 2014-06-18 MED ORDER — WARFARIN SODIUM 3 MG PO TABS
3.5000 mg | ORAL_TABLET | Freq: Once | ORAL | Status: AC
  Administered 2014-06-18: 3.5 mg via ORAL
  Filled 2014-06-18: qty 1

## 2014-06-18 NOTE — Progress Notes (Signed)
[facility] Progress Note   Subjective: a little more alert, eating some today per family  Filed Vitals:   06/17/14 0515 06/17/14 1330 06/17/14 2150 06/18/14 0455  BP: 157/76 125/56 110/54 116/55  Pulse: 97 95 95 94  Temp: 98.5 F (36.9 C) 98.6 F (37 C) 97.5 F (36.4 C) 98 F (36.7 C)  TempSrc: Axillary Axillary Oral Axillary  Resp: 20 18 18 18   Height:      Weight:    83.6 kg (184 lb 4.9 oz)  SpO2: 99% 98% 97% 98%   Exam: Lethargic, extremely fatigued, eating small amounts, fed by family No jvd Chest is clear bilat, marginal resp effort RRR no MRG heard Abd soft, obese, nondistended, +BS Trace edema pretib Neuro is nf, not jerking today, lethargic but awake  UA 11/16 negative BUN 138/3.52 yesterday, down to 111/2.32 today Baseline creat 1.8 - 2.3 between Jan and Sept 2015, baseline eGFR 23 - 30 CXR 11/16 no CHF, bibasilar atx, CM UOP 1195 yesterday Home BP meds/ diuretics - losartan, zaroxyln, torsemide  Assessment: 1 Acute on CKD4 - due to ARB/ hypotension / vol depletion. UA negative. Creat improving with IVF"s and coming close to baseline. Not a candidate for dialysis, have d/w w family.  This has been established previously with his nephrologist Dr Posey Pronto. They have had hospice arranged previously at home.   2 AMS - better 3 Dementia - recent onset per notes 4 R shoulder fracture - sept 2015, conservative mgmnt 5 HTN 6 CAD hx CABG 7 Hx bilat LE DVT 8 Hx depression  Plan- no further recommendations, will sign off.  Would avoid ARB/ACEi now in this patient who is at high risk for dehydration at this stage of life.  Please call w questions.     Kelly Splinter MD  pager [phone]    cell [phone]  06/18/2014, 8:33 AM     Recent Labs Lab 06/15/14 1447 06/15/14 2104 06/16/14 0425 06/17/14 0535  NA 139  --  140 136*  K 4.5  --  3.7 3.7  CL 95*  --  94* 95*  CO2 25  --  26 24  GLUCOSE 135*  --  138* 102*  BUN 138*  --  133* 111*   CREATININE 3.52*  --  3.28* 2.32*  CALCIUM 9.8  --  9.5 8.8  PHOS  --  4.3  --   --     Recent Labs Lab 06/15/14 1447 06/16/14 0425  AST 50* 48*  ALT 53 54*  ALKPHOS 143* 134*  BILITOT <0.2* 0.2*  PROT 8.1 7.7  ALBUMIN 2.9* 2.9*    Recent Labs Lab 06/15/14 1447 06/16/14 0425 06/17/14 0535  WBC 12.9* 12.8* 13.6*  HGB 13.0 12.1* 12.1*  HCT 39.3 37.6* 37.0*  MCV 93.3 94.0 94.4  PLT 294 279 255   . docusate sodium  300 mg Oral QHS  . feeding supplement (ENSURE COMPLETE)  237 mL Oral Q24H  . feeding supplement (ENSURE)  1 Container Oral Q24H  . mirtazapine  15 mg Oral QHS  . multivitamin with minerals  1 tablet Oral Daily  . polyethylene glycol  17 g Oral Daily  . sodium chloride  3 mL Intravenous Q12H  . tamsulosin  0.4 mg Oral Daily  . [START ON 06/22/2014] Vitamin D (Ergocalciferol)  50,000 Units Oral Q7 days  . Warfarin - Pharmacist Dosing Inpatient   Does not apply q1800   . sodium chloride 50 mL/hr at 06/17/14 2148   acetaminophen,  ALPRAZolam, clotrimazole, famotidine, HYDROcodone-acetaminophen, ondansetron **OR** ondansetron (ZOFRAN) IV

## 2014-06-18 NOTE — Plan of Care (Signed)
Problem: Phase II Progression Outcomes Goal: Vital signs remain stable Outcome: Completed/Met Date Met:  06/18/14 Goal: Other Phase II Outcomes/Goals Outcome: Completed/Met Date Met:  06/18/14  Problem: Phase III Progression Outcomes Goal: Pain controlled on oral analgesia Outcome: Completed/Met Date Met:  06/18/14

## 2014-06-18 NOTE — Progress Notes (Signed)
ANTICOAGULATION CONSULT NOTE - Follow-up  Pharmacy Consult for warfarin Indication: history of DVT  Allergies  Allergen Reactions  . Accupril [Quinapril Hcl] Cough  . Erythromycin Other (See Comments)    Very bad burning  . Niacin     REACTION: itching  . Paxil [Paroxetine Hcl] Other (See Comments)    unknown  . Statins Other (See Comments)    tremors   . Tramadol-Acetaminophen Rash    Patient Measurements: Height: 5\' 1"  (154.9 cm) Weight: 184 lb 4.9 oz (83.6 kg) IBW/kg (Calculated) : 52.3 Heparin Dosing Weight:   Vital Signs: Temp: 98 F (36.7 C) (11/19 0455) Temp Source: Axillary (11/19 0455) BP: 116/55 mmHg (11/19 0455) Pulse Rate: 94 (11/19 0455)  Labs:  Recent Labs  06/16/14 0425 06/17/14 0535 06/18/14 0446 06/18/14 0843  HGB 12.1* 12.1*  --  12.5*  HCT 37.6* 37.0*  --  38.6*  PLT 279 255  --  266  LABPROT 36.3* 31.3* 25.1*  --   INR 3.62* 2.99* 2.26*  --   CREATININE 3.28* 2.32*  --  1.49*    Estimated Creatinine Clearance: 31.4 mL/min (by C-G formula based on Cr of 1.49).  Assessment: [age] YOM presents with mental status changes and lethargy likely multifactorial.  On warfarin for h/o DVT.  INR supratherapeutic at time of admission, pharmacy asked to assist with dosing warfarin inpatient. Physician notes concern of possible risk of anticoagulation d/t falls/comorbities but appears family wishes to continue warfarin at this time.   Home dose: warfarin 2.5mg  MWF and 3.5mg  TTSS (last dose 11/15)  11/19:  INR = 2.26   Warfarin held form time of admission until 11/18 d/t supratherapeutic INR  CBC: Hbg = 12.5/stable and pltc WNL  Diet: regular - suspect not eating much d/t lethargy but documented that ate 75% dinner last pm  No major drug interactions  Goal of Therapy:  INR 2-3 Monitor platelets by anticoagulation protocol: Yes   Plan:   Warfarin 3.5mg  PO x1 tonight as INR has fallen into therapeutic range  Daily INR  Juliette Alcideustin Zeigler, PharmD,  BCPS.   Pager: [phone]  06/18/2014,11:01 AM

## 2014-06-18 NOTE — Progress Notes (Signed)
[facility] PROGRESS NOTE  Dale HeadlandWilliam G Whitehead Dale Whitehead. WUJ:[id number] DOB: March 15, 1924 DOA: 06/15/2014 PCP: Dale HillierLITTLE,KEVIN LORNE, Whitehead  Assessment/Plan: #1 acute encephalopathy Likely multi-factorial in nature secondary to dehydration a mild hyponatremia and uremia and acute on chronic kidney disease. Patient still lethargic, however more alert. Head CT negative. Chest x-ray negative for any acute infiltrate. Urinalysis negative for UTI. Torsemide and metolazone on hold. Patient on IV fluids.Dale Whitehead ff.   #2 hypertension Stable. Continue current regimen.  #3 history of DVT Coumadin per pharmacy.  #4 acute on chronic kidney disease stage IV/Uremia Currently close to baseline. Patient's baseline is around 2.28. Improved with gentle hydration. Patient likely uremic, but not a candidate for HD per Dale Whitehead. Likely needs hospice involvement. Consult with palliative care.  #5 right upper extremity humeral fracture  Xrays done. Outpatient follow-up.  #6 prophylaxis Coumadin for DVT prophylaxis.  Code Status: full Family Communication: updated patient and family at bedside. Disposition Plan: was medical stable home.    Consultants:  Dale Whitehead: Dale Dale Whitehead 06/17/14  Procedures:  CT head 06/15/2014  Chest x-ray 06/15/2014  Xray R shoulder and wrist 06/17/14  Antibiotics:  none  HPI/Subjective: Patient more alert today. Per family some improvement in patient's mentation.  Objective: Filed Vitals:   06/18/14 0455  BP: 116/55  Pulse: 94  Temp: 98 F (36.7 C)  Resp: 18    Intake/Output Summary (Last 24 hours) at 06/18/14 1143 Last data filed at 06/18/14 0900  Gross per 24 hour  Intake 2051.67 ml  Output   1350 ml  Net 701.67 ml   Filed Weights   06/16/14 0457 06/17/14 0038 06/18/14 0455  Weight: 84.3 kg (185 lb 13.6 oz) 83.7 kg (184 lb 8.4 oz) 83.6 kg (184 lb 4.9 oz)    Exam:   General:  More alert today.  Cardiovascular: RRR  Respiratory: CTAB anterior lung  fields  Abdomen: soft, nontender, nondistended, positive bowel sounds.  Musculoskeletal: no clubbing cyanosis or edema. Right upper extremity in a sling.  Data Reviewed: Basic Metabolic Panel:  Recent Labs Lab 06/15/14 1447 06/15/14 2104 06/16/14 0425 06/17/14 0535 06/18/14 0843  NA 139  --  140 136* 138  K 4.5  --  3.7 3.7 3.5*  CL 95*  --  94* 95* 97  CO2 25  --  26 24 25   GLUCOSE 135*  --  138* 102* 197*  BUN 138*  --  133* 111* 68*  CREATININE 3.52*  --  3.28* 2.32* 1.49*  CALCIUM 9.8  --  9.5 8.8 8.8  MG  --  3.9*  --   --   --   PHOS  --  4.3  --   --   --    Liver Function Tests:  Recent Labs Lab 06/15/14 1447 06/16/14 0425  AST 50* 48*  ALT 53 54*  ALKPHOS 143* 134*  BILITOT <0.2* 0.2*  PROT 8.1 7.7  ALBUMIN 2.9* 2.9*   No results for input(s): LIPASE, AMYLASE in the last 168 hours. No results for input(s): AMMONIA in the last 168 hours. CBC:  Recent Labs Lab 06/15/14 1447 06/16/14 0425 06/17/14 0535 06/18/14 0843  WBC 12.9* 12.8* 13.6* 13.9*  NEUTROABS  --   --   --  8.1*  HGB 13.0 12.1* 12.1* 12.5*  HCT 39.3 37.6* 37.0* 38.6*  MCV 93.3 94.0 94.4 92.8  PLT 294 279 255 266   Cardiac Enzymes: No results for input(s): CKTOTAL, CKMB, CKMBINDEX, TROPONINI in the last 168 hours. BNP (last 3 results) No  results for input(s): PROBNP in the last 8760 hours. CBG:  Recent Labs Lab 06/16/14 0735 06/17/14 0739 06/18/14 0741  GLUCAP 170* 112* 121*    No results found for this or any previous visit (from the past 240 hour(s)).   Studies: Dg Shoulder Right  06/17/2014   CLINICAL DATA:  F/u right proximal humerus and right wrist fx from 04-15-2014  EXAM: RIGHT SHOULDER - 2+ VIEW  COMPARISON:  04/15/2014  FINDINGS: The margins of the fractures are less well-defined reflecting interval bone resorption. Fractures still displaced, predominately with the shaft displacing anterior to the humeral head, also retracted superiorly, by approximately 1.2 cm,  which is less than on the prior study. There is milder displacement of the greater tuberosity fracture fragment. There is no dislocation. Glenohumeral joint is normally aligned.  No new fractures or abnormalities.  IMPRESSION: Comminuted proximal right humeral fracture. There has been resorption along the fracture line since prior study. There is no bridging callus formation. Degree of displacement is similar in the anterior direction, but there is less superior retraction of the humeral shaft fracture fragment. No other change.   Electronically Signed   By: Dale Portlandavid  Ormond M.D.   On: 06/17/2014 15:23   Dg Wrist Complete Right  06/17/2014   CLINICAL DATA:  Followup for proximal humerus fracture right wrist fracture from 04/15/2014.  EXAM: RIGHT WRIST - COMPLETE 3+ VIEW  COMPARISON:  None.  FINDINGS: The curvilinear calcification noted adjacent to the distal pole of the scaphoid is without change. This may be a soft tissue/vascular calcification. There is no convincing donor site for fracture.  No new abnormality. Joints are normally spaced and aligned. Bones are demineralized.  IMPRESSION: The calcification noted adjacent to the scaphoid on the prior study is unchanged. This is most likely a focal area of vascular calcification or other soft tissue calcification. It is felt unlikely reflect a chip fracture.   Electronically Signed   By: Dale Portlandavid  Ormond M.D.   On: 06/17/2014 15:29    Scheduled Meds: . docusate sodium  300 mg Oral QHS  . feeding supplement (ENSURE COMPLETE)  237 mL Oral Q24H  . feeding supplement (ENSURE)  1 Container Oral Q24H  . mirtazapine  15 mg Oral QHS  . multivitamin with minerals  1 tablet Oral Daily  . polyethylene glycol  17 g Oral Daily  . sodium chloride  3 mL Intravenous Q12H  . tamsulosin  0.4 mg Oral Daily  . [START ON 06/22/2014] Vitamin D (Ergocalciferol)  50,000 Units Oral Q7 days  . warfarin  3.5 mg Oral ONCE-1800  . Warfarin - Pharmacist Dosing Inpatient   Does not  apply q1800   Continuous Infusions: . sodium chloride 50 mL/hr at 06/17/14 2148    Principal Problem:   Acute encephalopathy Active Problems:   HTN (hypertension)   CAD (coronary artery disease) status post CABG 1985   History of DVT (deep vein thrombosis), recurrent   CKD (chronic kidney disease) stage 3, GFR 30-59 ml/min   Right humeral fracture   Malnutrition of moderate degree   ARF (acute Dale Whitehead failure)   Uremia    Time spent: 6335 MINS    [facility] CareHOMPSON,Demosthenes Virnig Whitehead [facility] Pager [phone]. If 7PM-7AM, please contact night-coverage at [email or web address], password [email or web address] 06/18/2014, 11:43 AM  LOS: 3 days

## 2014-06-19 LAB — BASIC METABOLIC PANEL
ANION GAP: 15 (ref 5–15)
BUN: 49 mg/dL — ABNORMAL HIGH (ref 6–23)
CALCIUM: 8.8 mg/dL (ref 8.4–10.5)
CO2: 23 meq/L (ref 19–32)
Chloride: 97 mEq/L (ref 96–112)
Creatinine, Ser: 1.31 mg/dL (ref 0.50–1.35)
GFR calc Af Amer: 54 mL/min — ABNORMAL LOW (ref >90)
GFR calc non Af Amer: 47 mL/min — ABNORMAL LOW (ref >90)
Glucose, Bld: 126 mg/dL — ABNORMAL HIGH (ref 70–99)
POTASSIUM: 3.5 meq/L — AB (ref 3.7–5.3)
SODIUM: 135 meq/L — AB (ref 137–147)

## 2014-06-19 LAB — GLUCOSE, CAPILLARY
GLUCOSE-CAPILLARY: 147 mg/dL — AB (ref 70–99)
Glucose-Capillary: 130 mg/dL — ABNORMAL HIGH (ref 70–99)

## 2014-06-19 LAB — PROTIME-INR
INR: 2.12 — AB (ref 0.00–1.49)
Prothrombin Time: 23.9 seconds — ABNORMAL HIGH (ref 11.6–15.2)

## 2014-06-19 MED ORDER — POTASSIUM CHLORIDE CRYS ER 20 MEQ PO TBCR
40.0000 meq | EXTENDED_RELEASE_TABLET | Freq: Once | ORAL | Status: AC
  Administered 2014-06-19: 40 meq via ORAL
  Filled 2014-06-19: qty 2

## 2014-06-19 MED ORDER — FUROSEMIDE 20 MG PO TABS
20.0000 mg | ORAL_TABLET | Freq: Once | ORAL | Status: AC
  Administered 2014-06-19: 20 mg via ORAL
  Filled 2014-06-19: qty 1

## 2014-06-19 MED ORDER — WARFARIN SODIUM 2.5 MG PO TABS
2.5000 mg | ORAL_TABLET | Freq: Once | ORAL | Status: AC
  Administered 2014-06-19: 2.5 mg via ORAL
  Filled 2014-06-19: qty 1

## 2014-06-19 NOTE — Plan of Care (Signed)
Problem: Phase I Progression Outcomes Goal: Initial discharge plan identified Outcome: Completed/Met Date Met:  06/19/14  Problem: Phase II Progression Outcomes Goal: Discharge plan established Outcome: Completed/Met Date Met:  06/19/14 Goal: IV changed to normal saline lock Outcome: Completed/Met Date Met:  06/19/14  Problem: Phase III Progression Outcomes Goal: Activity at appropriate level-compared to baseline (UP IN CHAIR FOR HEMODIALYSIS)  Outcome: Completed/Met Date Met:  06/19/14 Goal: Foley discontinued Outcome: Completed/Met Date Met:  06/19/14

## 2014-06-19 NOTE — Progress Notes (Signed)
[facility] PROGRESS NOTE  Dale HeadlandWilliam G Dhaliwal Jr. ZOX:[id number] DOB: 02/18/1924 DOA: 06/15/2014 PCP: Dale HillierLITTLE,KEVIN LORNE, MD  Assessment/Plan: #1 acute encephalopathy Likely multi-factorial in nature secondary to dehydration a mild hypernatremia and uremia and acute on chronic kidney disease. Patient still lethargic, however more alert. Head CT negative. Chest x-ray negative for any acute infiltrate. Urinalysis negative for UTI. Torsemide and metolazone on hold. NSL IVF.   #2 hypertension Stable. Continue current regimen.  #3 history of DVT Coumadin per pharmacy.  #4 acute on chronic kidney disease stage IV/Uremia Currently better than baseline. Patient's baseline is around 2.28. Improved with gentle hydration. Patient likely uremic, but not a candidate for HD per Renal. Likely needs hospice involvement. Palliative care consultation pending. Saline lock IV fluids.   #5 right upper extremity humeral fracture  Xrays done. Outpatient follow-up.  #6 prophylaxis Coumadin for DVT prophylaxis.  Code Status: full Family Communication: updated patient no family at bedside. Disposition Plan: Home with hospice probably tomorrow.     Consultants:  Renal: Dr Arta SilenceShertz 06/17/14  Procedures:  CT head 06/15/2014  Chest x-ray 06/15/2014  Xray R shoulder and wrist 06/17/14  Antibiotics:  none  HPI/Subjective: Patient more alert today.   Objective: Filed Vitals:   06/19/14 0616  BP: 126/76  Pulse: 97  Temp: 98.3 F (36.8 C)  Resp: 18    Intake/Output Summary (Last 24 hours) at 06/19/14 1800 Last data filed at 06/19/14 0829  Gross per 24 hour  Intake 1313.33 ml  Output      0 ml  Net 1313.33 ml   Filed Weights   06/17/14 0038 06/18/14 0455 06/19/14 0616  Weight: 83.7 kg (184 lb 8.4 oz) 83.6 kg (184 lb 4.9 oz) 83.5 kg (184 lb 1.4 oz)    Exam:   General:  More alert today.  Cardiovascular: RRR  Respiratory: CTAB anterior lung fields  Abdomen: soft, nontender,  nondistended, positive bowel sounds.  Musculoskeletal: no clubbing cyanosis. 1-2 + BLE edema. Right upper extremity in a sling.  Data Reviewed: Basic Metabolic Panel:  Recent Labs Lab 06/15/14 1447 06/15/14 2104 06/16/14 0425 06/17/14 0535 06/18/14 0843 06/19/14 0515  NA 139  --  140 136* 138 135*  K 4.5  --  3.7 3.7 3.5* 3.5*  CL 95*  --  94* 95* 97 97  CO2 25  --  26 24 25 23   GLUCOSE 135*  --  138* 102* 197* 126*  BUN 138*  --  133* 111* 68* 49*  CREATININE 3.52*  --  3.28* 2.32* 1.49* 1.31  CALCIUM 9.8  --  9.5 8.8 8.8 8.8  MG  --  3.9*  --   --   --   --   PHOS  --  4.3  --   --   --   --    Liver Function Tests:  Recent Labs Lab 06/15/14 1447 06/16/14 0425  AST 50* 48*  ALT 53 54*  ALKPHOS 143* 134*  BILITOT <0.2* 0.2*  PROT 8.1 7.7  ALBUMIN 2.9* 2.9*   No results for input(s): LIPASE, AMYLASE in the last 168 hours. No results for input(s): AMMONIA in the last 168 hours. CBC:  Recent Labs Lab 06/15/14 1447 06/16/14 0425 06/17/14 0535 06/18/14 0843  WBC 12.9* 12.8* 13.6* 13.9*  NEUTROABS  --   --   --  8.1*  HGB 13.0 12.1* 12.1* 12.5*  HCT 39.3 37.6* 37.0* 38.6*  MCV 93.3 94.0 94.4 92.8  PLT 294 279 255 266   Cardiac  Enzymes: No results for input(s): CKTOTAL, CKMB, CKMBINDEX, TROPONINI in the last 168 hours. BNP (last 3 results) No results for input(s): PROBNP in the last 8760 hours. CBG:  Recent Labs Lab 06/16/14 0735 06/17/14 0739 06/18/14 0741 06/18/14 2121 06/19/14 0741  GLUCAP 170* 112* 121* 147* 130*    No results found for this or any previous visit (from the past 240 hour(s)).   Studies: No results found.  Scheduled Meds: . docusate sodium  300 mg Oral QHS  . feeding supplement (ENSURE COMPLETE)  237 mL Oral Q24H  . feeding supplement (ENSURE)  1 Container Oral Q24H  . mirtazapine  15 mg Oral QHS  . multivitamin with minerals  1 tablet Oral Daily  . polyethylene glycol  17 g Oral Daily  . sodium chloride  3 mL  Intravenous Q12H  . tamsulosin  0.4 mg Oral Daily  . [START ON 06/22/2014] Vitamin D (Ergocalciferol)  50,000 Units Oral Q7 days  . Warfarin - Pharmacist Dosing Inpatient   Does not apply q1800   Continuous Infusions:    Principal Problem:   Acute encephalopathy Active Problems:   HTN (hypertension)   CAD (coronary artery disease) status post CABG 1985   History of DVT (deep vein thrombosis), recurrent   CKD (chronic kidney disease) stage 3, GFR 30-59 ml/min   Right humeral fracture   Malnutrition of moderate degree   ARF (acute renal failure)   Uremia    Time spent: 35 MINS    [facility] HospitalHOMPSON,Naraly Fritcher MD [facility] Pager [phone]. If 7PM-7AM, please contact night-coverage at [email or web address], password [email or web address] 06/19/2014, 6:00 PM  LOS: 4 days

## 2014-06-19 NOTE — Progress Notes (Signed)
ANTICOAGULATION CONSULT NOTE - Follow-up  Pharmacy Consult for warfarin Indication: history of DVT  Allergies  Allergen Reactions  . Accupril [Quinapril Hcl] Cough  . Erythromycin Other (See Comments)    Very bad burning  . Niacin     REACTION: itching  . Paxil [Paroxetine Hcl] Other (See Comments)    unknown  . Statins Other (See Comments)    tremors   . Tramadol-Acetaminophen Rash    Patient Measurements: Height: 5\' 1"  (154.9 cm) Weight: 184 lb 1.4 oz (83.5 kg) IBW/kg (Calculated) : 52.3 Heparin Dosing Weight:   Vital Signs: Temp: 98.3 F (36.8 C) (11/20 0616) Temp Source: Axillary (11/20 0616) BP: 126/76 mmHg (11/20 0616) Pulse Rate: 97 (11/20 0616)  Labs:  Recent Labs  06/17/14 0535 06/18/14 0446 06/18/14 0843 06/19/14 0515  HGB 12.1*  --  12.5*  --   HCT 37.0*  --  38.6*  --   PLT 255  --  266  --   LABPROT 31.3* 25.1*  --  23.9*  INR 2.99* 2.26*  --  2.12*  CREATININE 2.32*  --  1.49* 1.31    Estimated Creatinine Clearance: 35.7 mL/min (by C-G formula based on Cr of 1.31).  Assessment: [age] YOM presents with mental status changes and lethargy likely multifactorial.  On warfarin for h/o DVT.  INR supratherapeutic at time of admission, pharmacy asked to assist with dosing warfarin inpatient. Physician notes concern of possible risk of anticoagulation d/t falls/comorbities but appears family wishes to continue warfarin at this time.   Home dose: warfarin 2.5mg  MWF and 3.5mg  TTSS (last dose 11/15)  11/20:  INR = 2.12 (therapeutic)  Warfarin held form time of admission until 11/18 d/t supratherapeutic INR  CBC: Hbg = 12.5/stable and pltc WNL  Diet: regular - suspect not eating much d/t lethargy at admission but eating ~50% of meals  No major drug interactions  Goal of Therapy:  INR 2-3 Monitor platelets by anticoagulation protocol: Yes   Plan:   Warfarin 2.5mg  PO x1 tonight as per home dose  Daily INR  Juliette Alcideustin Zeigler, PharmD, BCPS.   Pager:  [phone]  06/19/2014,11:12 AM

## 2014-06-19 NOTE — Plan of Care (Signed)
Problem: Consults Goal: Skin Care Protocol Initiated - if Braden Score 18 or less If consults are not indicated, leave blank or document N/A  Outcome: Not Applicable Date Met:  84/06/98

## 2014-06-19 NOTE — Progress Notes (Addendum)
Physical Therapy Treatment Patient Details Name: Dale HeadlandWilliam G Akridge Jr. MRN: [id number] DOB: [birth year] Today's Date: 06/19/2014    History of Present Illness 80 yo with recent hospitalization in Sept  with R humeral and R scaphoid fracture and increased confusion. After last hopsitalization , [facility] and then discharged home. Prior to the Sept admission pt was able to transfer with help and use RW (rollator) for short bouts.      PT Comments    Total assist for bed mobility and sitting EOB. Pt agreeable but only able to participate minimally. Family present-plan is for pt to return home possibly with hospice.   Follow Up Recommendations  Home health PT (SNF recommended but family declines placement. Plan is possibly home with hospice)     Equipment Recommendations  None recommended by PT    Recommendations for Other Services       Precautions / Restrictions Precautions Precautions: Shoulder Type of Shoulder Precautions: No activity orders in chart Precaution Comments: sling RUE and wrist/thumb splint Required Braces or Orthoses: Sling;Other Brace/Splint Other Brace/Splint: wrist/thumb    Mobility  Bed Mobility Overal bed mobility: Needs Assistance;+2 for physical assistance;+ 2 for safety/equipment Bed Mobility: Supine to Sit;Sit to Supine     Supine to sit: Total assist;+2 for safety/equipment;HOB elevated;+2 for physical assistance Sit to supine: Total assist;+2 for physical assistance;+2 for safety/equipment   General bed mobility comments: Pt required assist for all aspects  Transfers                 General transfer comment: Pt unable   Ambulation/Gait                 Stairs            Wheelchair Mobility    Modified Rankin (Stroke Patients Only)       Balance Overall balance assessment: Needs assistance;History of Falls Sitting-balance support: Feet unsupported Sitting balance-Leahy Scale:  Zero Sitting balance - Comments: Pt sat EOB x 15 mins with total A to periods of mod A.  Pt did attempt to reach with Lt. UE x 3 with prompting.   Postural control: Right lateral lean                          Cognition Arousal/Alertness: Awake/alert Behavior During Therapy: Flat affect Overall Cognitive Status: Impaired/Different from baseline Area of Impairment: Attention;Following commands   Current Attention Level: Focused   Following Commands: Follows one step commands inconsistently            Exercises General Exercises - Lower Extremity Ankle Circles/Pumps: AROM;PROM;Both;5 reps;Supine Heel Slides: AAROM;Both;5 reps;Supine Hip ABduction/ADduction: AAROM;Both;5 reps;Supine    General Comments        Pertinent Vitals/Pain Pain Assessment: Faces Faces Pain Scale: Hurts little more Pain Location: Rt shoulder/UE with movement supine to EOB sitting  Pain Descriptors / Indicators: Grimacing Pain Intervention(s): Monitored during session    Home Living                      Prior Function            PT Goals (current goals can now be found in the care plan section) Progress towards PT goals: Not progressing toward goals - comment    Frequency  Min 2X/week    PT Plan Frequency needs to be updated    Co-evaluation   Reason for Co-Treatment:  For patient/therapist safety;Complexity of the patient's impairments (multi-system involvement)   OT goals addressed during session: Strengthening/ROM     End of Session   Activity Tolerance: Patient limited by lethargy Patient left: in bed;with call bell/phone within reach;with bed alarm set;with family/visitor present     Time: [id number] PT Time Calculation (min) (ACUTE ONLY): 32 min  Charges:  $Therapeutic Activity: 8-22 mins                    G Codes:      Rebeca AlertJannie Marchello Rothgeb, MPT Pager: [phone]

## 2014-06-19 NOTE — Progress Notes (Signed)
Occupational Therapy Treatment Patient Details Name: Dale HeadlandWilliam G Ayub Jr. MRN: [id number] DOB: 06/14/1924 Today's Date: 06/19/2014    History of present illness 80 yo with recent hospitalization in Sept  with R humeral and R scaphoid fracture and increased confusion. After last hopsitalization , [facility] and then discharged home. Prior to the Sept admission pt was able to transfer with help and use RW (rollator) for short bouts.     OT comments  Pt tolerated EOB sitting with mod A - total A.  He requires total A +2 for bed mobility.  Pt awake during session and follows one step commands intermittently.  Sling readjusted and padding added around posterior D-ring as it was causing pressure.   Follow Up Recommendations  SNF;Supervision/Assistance - 24 hour    Equipment Recommendations  None recommended by OT    Recommendations for Other Services      Precautions / Restrictions Precautions Precautions: Shoulder Type of Shoulder Precautions: No activity orders in chart Precaution Comments: sling RUE and wrist/thumb splint Required Braces or Orthoses: Sling;Other Brace/Splint       Mobility Bed Mobility Overal bed mobility: Needs Assistance Bed Mobility: Supine to Sit;Sit to Supine     Supine to sit: Total assist;+2 for physical assistance Sit to supine: Total assist;+2 for physical assistance   General bed mobility comments: Pt required assist for all aspects  Transfers                 General transfer comment: Pt unable     Balance Overall balance assessment: Needs assistance Sitting-balance support: Feet unsupported Sitting balance-Leahy Scale: Zero Sitting balance - Comments: Pt sat EOB x 15 mins with total A to periods of mod A.  Pt did attempt to reach with Lt. UE x 3 with prompting.  Padding added to D-ring on posterior aspect of sling as it was causing pressure.  Sling readjusted while pt was sitting.  Postural control:  Right lateral lean                         ADL Overall ADL's : Needs assistance/impaired                                       General ADL Comments: Pt requires total A with all aspects of self care      Vision                     Perception     Praxis      Cognition   Behavior During Therapy: Flat affect Overall Cognitive Status: Impaired/Different from baseline Area of Impairment: Attention;Following commands   Current Attention Level: Focused    Following Commands: Follows one step commands consistently            Extremity/Trunk Assessment               Exercises     Shoulder Instructions       General Comments      Pertinent Vitals/ Pain       Pain Assessment: Faces Faces Pain Scale: Hurts little more Pain Location: Rt shoulder/UE with movement supine to EOB sitting  Pain Descriptors / Indicators: Grimacing  Home Living  Prior Functioning/Environment              Frequency Min 2X/week     Progress Toward Goals  OT Goals(current goals can now be found in the care plan section)  Progress towards OT goals: Progressing toward goals (slowly)     Plan Discharge plan remains appropriate    Co-evaluation    PT/OT/SLP Co-Evaluation/Treatment: Yes Reason for Co-Treatment: For patient/therapist safety;Complexity of the patient's impairments (multi-system involvement)   OT goals addressed during session: Strengthening/ROM      End of Session Equipment Utilized During Treatment: Oxygen;Other (comment) (sling )   Activity Tolerance Patient limited by fatigue   Patient Left in bed;with call bell/phone within reach;with bed alarm set;with family/visitor present   Nurse Communication Mobility status        Time: [id number] OT Time Calculation (min): 33 min  Charges: OT General Charges $OT Visit: 1 Procedure OT Treatments $Therapeutic  Activity: 8-22 mins  Ayako Tapanes M 06/19/2014, 3:34 PM

## 2014-06-20 DIAGNOSIS — S42301A Unspecified fracture of shaft of humerus, right arm, initial encounter for closed fracture: Secondary | ICD-10-CM

## 2014-06-20 LAB — GLUCOSE, CAPILLARY: Glucose-Capillary: 128 mg/dL — ABNORMAL HIGH (ref 70–99)

## 2014-06-20 LAB — BASIC METABOLIC PANEL
Anion gap: 16 — ABNORMAL HIGH (ref 5–15)
BUN: 37 mg/dL — AB (ref 6–23)
CO2: 24 mEq/L (ref 19–32)
Calcium: 8.5 mg/dL (ref 8.4–10.5)
Chloride: 95 mEq/L — ABNORMAL LOW (ref 96–112)
Creatinine, Ser: 1.21 mg/dL (ref 0.50–1.35)
GFR, EST AFRICAN AMERICAN: 60 mL/min — AB (ref >90)
GFR, EST NON AFRICAN AMERICAN: 52 mL/min — AB (ref >90)
Glucose, Bld: 142 mg/dL — ABNORMAL HIGH (ref 70–99)
Potassium: 3.6 mEq/L — ABNORMAL LOW (ref 3.7–5.3)
Sodium: 135 mEq/L — ABNORMAL LOW (ref 137–147)

## 2014-06-20 LAB — PROTIME-INR
INR: 2.24 — ABNORMAL HIGH (ref 0.00–1.49)
Prothrombin Time: 24.9 seconds — ABNORMAL HIGH (ref 11.6–15.2)

## 2014-06-20 MED ORDER — POTASSIUM CHLORIDE CRYS ER 20 MEQ PO TBCR
40.0000 meq | EXTENDED_RELEASE_TABLET | Freq: Once | ORAL | Status: AC
  Administered 2014-06-20: 40 meq via ORAL
  Filled 2014-06-20: qty 2

## 2014-06-20 MED ORDER — ENSURE COMPLETE PO LIQD: 237.0000 mL | ORAL | Status: AC

## 2014-06-20 MED ORDER — WARFARIN SODIUM 2.5 MG PO TABS
2.5000 mg | ORAL_TABLET | Freq: Once | ORAL | Status: DC
  Filled 2014-06-20: qty 1

## 2014-06-20 MED ORDER — FUROSEMIDE 20 MG PO TABS
20.0000 mg | ORAL_TABLET | Freq: Once | ORAL | Status: AC
  Administered 2014-06-20: 20 mg via ORAL
  Filled 2014-06-20: qty 1

## 2014-06-20 NOTE — Progress Notes (Signed)
CARE MANAGEMENT NOTE 06/20/2014  Patient:  Dale Whitehead,Dale G   Account Number:  [id number]  Date Initiated:  06/16/2014  Documentation initiated by:  [facility] LLC[facility]HABIR,KATHY  Subjective/Objective Assessment:   80 Y/O M ADMITTED W/ACUTE ENCEPHALOPATHY.     Action/Plan:   FROM HOME.HAS ALL DME.ACTIVE W/[facility] HHRN/PT/OT.   Anticipated DC Date:  06/22/2014   Anticipated DC Plan:  HOME W HOSPICE CARE  In-house referral  Clinical Social Worker      DC Planning Services  CM consult      PAC Choice  HOSPICE   Choice offered to / List presented to:  C-4 Adult Children        HH arranged  HH-1 RN      [facility]   Status of service:  Completed, signed off Medicare Important Message given?  YES (If response is "NO", the following Medicare IM given date fields will be blank) Date Medicare IM given:  06/19/2014 Medicare IM given by:  [facility] Date Additional Medicare IM given:   Additional Medicare IM given by:    Discharge Disposition:  HOME W HOSPICE CARE  Per UR Regulation:  Reviewed for med. necessity/level of care/duration of stay  If discussed at Long Length of Stay Meetings, dates discussed:    Comments:  06/20/2014 1400 NCM spoke to Hospice oncall RN and made aware of new referral. Faxed order, facesheet, dc summary and H&P. Pt has hoyer lift, wheelchair and trapeze with [facility]. NCM explained once admitted to Hospice service they will [facility] bed [facility]. Isidoro DonningAlesia Shalene Gallen RN Case Mgmt phone [phone]  06/20/2014 1100 NCM spoke to dtr and offered choice for Hospice. She reqested [facility]. States she has hospital bed, wheelchair, oxygen, and hoyer lift at home. Isidoro DonningAlesia Eero Dini RN CCM Case Mgmt phone [phone]  06/19/14 KATHY MAHABIR RN,BSN NCM [phone] SPOKE TO DTR HCP[facility],SHE WANTS HOME W/HOSPICE SERVICES W/[facility].SHE STATES SHE HAS HAD [facility] PAST,BUT WHEN I CONTACTED THEM-THEY ARE NO  LONGER ON SERVICE & THEY HAD ALREADY TAKEN ALL OF THEIR DME BACK.[facility] IS CURRENTLY ACTIVE W/PATIENT.WILL AWAIT CONS TO OFFER HOME HOSPICE CHOICE.MD NOTIFIED WHO IS WAITING FOR PALLIATIVE TO CONS.DTR STTES SHE HAS DME IN THE HOME-HOSPITAL BED,02,OVERBED TABLE,RW,ROLLATOR,HOYER LIFT.  06/16/14 KATHY MAHABIR RN,BSN NCM [phone] [facility] K[facility]ST[facility] ALREADY FOLLOWING.AWAIT PT RECOMMENDATIONS.AMBULANCE TRANSP NEEDED @ D/C.CSW NOTIFIED.

## 2014-06-20 NOTE — Progress Notes (Signed)
ANTICOAGULATION CONSULT NOTE - Follow-up  Pharmacy Consult for warfarin Indication: history of DVT  Allergies  Allergen Reactions  . Accupril [Quinapril Hcl] Cough  . Erythromycin Other (See Comments)    Very bad burning  . Niacin     REACTION: itching  . Paxil [Paroxetine Hcl] Other (See Comments)    unknown  . Statins Other (See Comments)    tremors   . Tramadol-Acetaminophen Rash    Patient Measurements: Height: 5\' 1"  (154.9 cm) Weight: 188 lb 0.8 oz (85.3 kg) IBW/kg (Calculated) : 52.3   Vital Signs: Temp: 98.7 F (37.1 C) (11/21 0431) Temp Source: Oral (11/21 0431) BP: 130/72 mmHg (11/21 0431) Pulse Rate: 100 (11/21 0431)  Labs:  Recent Labs  06/18/14 0446 06/18/14 0843 06/19/14 0515 06/20/14 0345 06/20/14 0655  HGB  --  12.5*  --   --   --   HCT  --  38.6*  --   --   --   PLT  --  266  --   --   --   LABPROT 25.1*  --  23.9*  --  24.9*  INR 2.26*  --  2.12*  --  2.24*  CREATININE  --  1.49* 1.31 1.21  --     Estimated Creatinine Clearance: 39.1 mL/min (by C-G formula based on Cr of 1.21).   Inpatient warfarin doses administered: 11/16: none (INR supratherapeutic) 11/17: none (INR supratherapeutic) 11/18: 2.5 mg 11/19: 3.5 mg 11/20: 2.5 mg   Assessment: [age] YOM presented with mental status changes and lethargy likely multifactorial.  On warfarin for h/o DVT.  INR supratherapeutic at time of admission, pharmacy was asked to assist with dosing warfarin inpatient. Physician notes concern of possible risk of anticoagulation d/t falls/comorbities but appears family wishes to continue warfarin at this time.  Home dose: warfarin 2.5mg  MWF and 3.5mg  TTSS (last dose 11/15).  INR was supratherapeutic on admission.  Today, 11/21  INR therapeutic (2.24).  Mental status reportedly improved, charted as eating ~50% of tray portions on regular diet.  No bleeding reported in most recent chart notes.  Goal of Therapy:  INR 2-3    Plan:  1. Warfarin 2.5mg   PO x1 today. (Lower than PTA dosage, but INR supratherapeutic on admission and warfarin requirements may not return to previous until patient tolerating his usual diet). 2. Daily INR. 3. Follow clinical course, monitor for reports of bleeding.  Elie Goodyandy Janese Radabaugh, PharmD, BCPS Pager: [phone] 06/20/2014  9:59 AM

## 2014-06-20 NOTE — Discharge Summary (Signed)
Physician Discharge Summary  Dale Whitehead. ZOX:[id number] DOB: [birth year] DOA: 06/15/2014  PCP: [name], MD  Admit date: 06/15/2014 Discharge date: 06/20/2014  Time spent: 65 minutes  Recommendations for Outpatient Follow-up:  1. Follow-up with [name], MDIn 1-2 weeks. A follow-up patient is a basic metabolic profile done to follow-up on electrolytes and renal function. Patient is also being discharged home with hospice. 2. Follow-up with Dr. Ranell Patrick of orthopedics as previously scheduled. 3. PT/INR check in 1 week.  Discharge Diagnoses:  Principal Problem:   Acute encephalopathy Active Problems:   HTN (hypertension)   CAD (coronary artery disease) status post CABG 1985   History of DVT (deep vein thrombosis), recurrent   CKD (chronic kidney disease) stage 3, GFR 30-59 ml/min   Right humeral fracture   Malnutrition of moderate degree   ARF (acute renal failure)   Uremia   Discharge Condition: stable and improved  Diet recommendation: regular  Filed Weights   06/18/14 0455 06/19/14 0616 06/20/14 0431  Weight: 83.6 kg (184 lb 4.9 oz) 83.5 kg (184 lb 1.4 oz) 85.3 kg (188 lb 0.8 oz)    History of present illness:  80 year old male with past medical history of dementia, hypertension, CXD stage 3, CAD status post CABG, DVT on anticoagulation with coumadin who presented from SNF to [facility] ED 06/15/2014 with more altered mental status, more aggressive behavior and confusion for past few days prior to this admission. Pt is not a good historian due to his mental status. Family not at the bedside to provide details of medical history. No reports of respiratory distress. No reports of fevers or cough. No reports of falls or loss of consciousness. No reports of diarrhea or constipation. No vomiting. No reports of blood in stool or urine.    In ED, BP is 145/113 , HR 100-109, RR 18, T max 98.6 F and oxygen saturation of 97% on [place] oxygen support. Blood work showed WBC  count of 12.9, creatinine of 3.52, INR 3.88. No acute intracranial findings seen on CT head. CXR showed low lung volumes with bibasilar atelectasis. Pt admitted for further evaluation of altered mental status.   Hospital Course:  #1 acute encephalopathy Likely multi-factorial in nature secondary to dehydration a mild hypernatremia and uremia and acute on chronic kidney disease. Patient was hydrated gently with IV fluids. Patient's hypernatremia improved. Patient improved slowly but clinically during the hospitalization. CT of the head which was done was negative. Chest x-ray which was done was negative for any acute infiltrate. Urinalysis which was done was negative. Patient's torsemide a metolazone were held during the hospitalization. Patient improved clinically patient's renal function improved. IV fluids were subsequently discontinued.patient was tolerating greater than 50% of his meal. Patient was also seen in consultation by nephrology who felt patient was not a dialysis candidate. Patient was seen by palliative care. Patient be discharged home with hospice. Patient was close to baseline by day of discharge.  #2 hypertension Stable. Continued on home regimen.diuretics were held.  #3 history of DVT Patient was maintained on Coumadin during the hospitalization and doses adjusted per pharmacy. Patient will be discharged home on home regimen of coumadin and will need close follow-up as outpatient.  #4 acute on chronic kidney disease stage IV/Uremia Currently better than baseline. Patient's baseline is around 2.28. Improved with gentle hydration. Patient likely uremic, but not a candidate for HD per Renal. Likely needs hospice involvement. Palliative care consultation has been obtained patient will be discharged home with  hospice. Follow up with nephrologist as previously scheduled.  #5 right upper extremity humeral fracture Xrays done. Outpatient follow-up.   Procedures:  CT head  06/15/2014  Chest x-ray 06/15/2014  Xray R shoulder and wrist 06/17/14    Consultations:  Renal: Dr Arta Silence 06/17/14  Discharge Exam: Filed Vitals:   06/20/14 0431  BP: 130/72  Pulse: 100  Temp: 98.7 F (37.1 C)  Resp: 17    General: NAD Cardiovascular: RRR Respiratory: CTAB anterior lung fields  Discharge Instructions You were cared for by a hospitalist during your hospital stay. If you have any questions about your discharge medications or the care you received while you were in the hospital after you are discharged, you can call the unit and asked to speak with the hospitalist on call if the hospitalist that took care of you is not available. Once you are discharged, your primary care physician will handle any further medical issues. Please note that NO REFILLS for any discharge medications will be authorized once you are discharged, as it is imperative that you return to your primary care physician (or establish a relationship with a primary care physician if you do not have one) for your aftercare needs so that they can reassess your need for medications and monitor your lab values.  Discharge Instructions    Diet general    Complete by:  As directed      Discharge instructions    Complete by:  As directed   FOLLOW UP WITH [name], MD IN 1-2 WEEKS HOSPICE WILL FOLLOW AT HOME. FOLLOW UP WITH DR Ranell Patrick AS SCHEDULED     Increase activity slowly    Complete by:  As directed           Current Discharge Medication List    START taking these medications   Details  feeding supplement, ENSURE COMPLETE, (ENSURE COMPLETE) LIQD Take 237 mLs by mouth daily.      CONTINUE these medications which have NOT CHANGED   Details  acetaminophen (TYLENOL) 500 MG tablet Take 500-1,000 mg by mouth every 6 (six) hours as needed for mild pain.    ALPRAZolam (XANAX) 0.25 MG tablet Take 0.25 mg by mouth 2 (two) times daily as needed for anxiety.    clotrimazole (LOTRIMIN) 1 %  cream Apply 1 application topically daily as needed (yeast).     docusate sodium (COLACE) 100 MG capsule Take 300 mg by mouth at bedtime.    famotidine (PEPCID) 10 MG tablet Take 10 mg by mouth daily as needed for heartburn.     mirtazapine (REMERON) 15 MG tablet Take 15 mg by mouth at bedtime.    Multiple Vitamin (MULTIVITAMIN) tablet Take 1 tablet by mouth daily.    mupirocin ointment (BACTROBAN) 2 % Place 1 application into the nose daily. As needed    oxyCODONE-acetaminophen (PERCOCET/ROXICET) 5-325 MG per tablet Take 1-2 tablets by mouth every 4 (four) hours as needed for moderate pain. Qty: 30 tablet, Refills: 0    Polyethyl Glycol-Propyl Glycol (SYSTANE OP) Apply 1 drop to eye daily as needed (dry/irritated eyes).    polyethylene glycol (MIRALAX / GLYCOLAX) packet Take 17 g by mouth daily. Qty: 14 each, Refills: 0    potassium chloride (K-DUR,KLOR-CON) 10 MEQ tablet Take 3 tablets (30 mEq total) by mouth daily. Qty: 90 tablet, Refills: 11    silodosin (RAPAFLO) 8 MG CAPS capsule Take 8 mg by mouth daily with breakfast.    torsemide (DEMADEX) 20 MG tablet TAKE 1 TABLET BY  MOUTH DAILY AS NEEDED Qty: 60 tablet, Refills: 3    Vitamin D, Ergocalciferol, (DRISDOL) 50000 UNITS CAPS capsule Take 50,000 Units by mouth every 7 (seven) days. Every monday    !! warfarin (COUMADIN) 1 MG tablet Take 1 mg by mouth daily. Every Saturday, Sunday, Tuesday, and Thursday take 3.5 (combination of warfarin 2.5mg  and warfarin 1mg ) Every Monday, Wednesday, and Friday take 2.5mg .    !! warfarin (COUMADIN) 2.5 MG tablet Take 2.5 mg by mouth daily. Every Monday, Wednesday, and Friday take 2.5 Every Saturday, Sunday, Tuesday, and Thursday take 3.5 (combination of warfarin 2.5mg  and warfarin 1mg )    HYDROcodone-acetaminophen (NORCO/VICODIN) 5-325 MG per tablet Take 1 tablet by mouth 3 (three) times daily as needed for moderate pain or severe pain.    !! warfarin (COUMADIN) 4 MG tablet Take 4-6 mg by  mouth every evening. Take 4mg  everyday except Wednesday take 6mg .     !! - Potential duplicate medications found. Please discuss with provider.    STOP taking these medications     losartan (COZAAR) 50 MG tablet      metolazone (ZAROXOLYN) 2.5 MG tablet        Allergies  Allergen Reactions  . Accupril [Quinapril Hcl] Cough  . Erythromycin Other (See Comments)    Very bad burning  . Niacin     REACTION: itching  . Paxil [Paroxetine Hcl] Other (See Comments)    unknown  . Statins Other (See Comments)    tremors   . Tramadol-Acetaminophen Rash   Follow-up Information    [facility]   Specialty:  Hospice and Palliative Medicine   Why:  Hospice will give you a call to arrange time to follow up with appointment.    Contact information:   [address] [place] [id number] [phone]       Follow up with Mickie HillierLITTLE,KEVIN LORNE, MD. Schedule an appointment as soon as possible for a visit in 1 week.   Specialty:  Family Medicine   Why:  f/u 1-2 weeks   Contact information:   [address] [place] [address] [phone]       Follow up with Verlee RossettiNORRIS,STEVEN R, MD.   Specialty:  Orthopedic Surgery   Why:  f/u as scheduled   Contact information:   [address] [place] [address] [phone]       Follow up On 06/26/2014.   Why:  needs PT/INR check in 1 week.       The results of significant diagnostics from this hospitalization (including imaging, microbiology, ancillary and laboratory) are listed below for reference.    Significant Diagnostic Studies: Dg Chest 2 View  06/15/2014   CLINICAL DATA:  Aggressive behavior and confusion since 06/11/2014, weakness, shortness of breath, on home oxygen, dementia, history CHF, hypertension, coronary disease post MI and CABG  EXAM: CHEST  2 VIEW  COMPARISON:  04/20/2014  FINDINGS: Enlargement of cardiac silhouette post median sternotomy.  Stable mediastinal contours.   Normal pulmonary vascularity.  Decreased lung volumes with crowding of perihilar markings and minimal basilar atelectasis.  No acute infiltrate, pleural effusion or pneumothorax.  Post LEFT shoulder replacement.  Subacute fracture proximal RIGHT humerus again noted.  IMPRESSION: Low lung volumes with bibasilar atelectasis.  Enlargement of cardiac silhouette post median sternotomy.   Electronically Signed   By: Ulyses SouthwardMark  Boles M.D.   On: 06/15/2014 17:08   Dg Shoulder Right  06/17/2014   CLINICAL DATA:  F/u right proximal humerus  and right wrist fx from 04-15-2014  EXAM: RIGHT SHOULDER - 2+ VIEW  COMPARISON:  04/15/2014  FINDINGS: The margins of the fractures are less well-defined reflecting interval bone resorption. Fractures still displaced, predominately with the shaft displacing anterior to the humeral head, also retracted superiorly, by approximately 1.2 cm, which is less than on the prior study. There is milder displacement of the greater tuberosity fracture fragment. There is no dislocation. Glenohumeral joint is normally aligned.  No new fractures or abnormalities.  IMPRESSION: Comminuted proximal right humeral fracture. There has been resorption along the fracture line since prior study. There is no bridging callus formation. Degree of displacement is similar in the anterior direction, but there is less superior retraction of the humeral shaft fracture fragment. No other change.   Electronically Signed   By: Amie Portland M.D.   On: 06/17/2014 15:23   Dg Wrist Complete Right  06/17/2014   CLINICAL DATA:  Followup for proximal humerus fracture right wrist fracture from 04/15/2014.  EXAM: RIGHT WRIST - COMPLETE 3+ VIEW  COMPARISON:  None.  FINDINGS: The curvilinear calcification noted adjacent to the distal pole of the scaphoid is without change. This may be a soft tissue/vascular calcification. There is no convincing donor site for fracture.  No new abnormality. Joints are normally spaced and aligned.  Bones are demineralized.  IMPRESSION: The calcification noted adjacent to the scaphoid on the prior study is unchanged. This is most likely a focal area of vascular calcification or other soft tissue calcification. It is felt unlikely reflect a chip fracture.   Electronically Signed   By: Amie Portland M.D.   On: 06/17/2014 15:29   Ct Head Wo Contrast  06/15/2014   CLINICAL DATA:  80 year old male with dementia with aggressive behavior and confusion since 06/19/2014. Initial encounter.  EXAM: CT HEAD WITHOUT CONTRAST  TECHNIQUE: Contiguous axial images were obtained from the base of the skull through the vertex without intravenous contrast.  COMPARISON:  04/19/2014.  FINDINGS: No intracranial hemorrhage.  Small vessel disease type changes without CT evidence of large acute infarct.  Atrophy. Ventricular prominence stable and may reflect central atrophy although mild stable hydrocephalus may also be present.  No intracranial mass lesion noted on this unenhanced exam.  Vascular calcifications.  Opacification left sphenoid sinus.  IMPRESSION: No intracranial hemorrhage.  Small vessel disease type changes without CT evidence of large acute infarct.  Atrophy. Ventricular prominence stable and may reflect central atrophy although mild stable hydrocephalus may also be present.  Opacification left sphenoid sinus.   Electronically Signed   By: Bridgett Larsson M.D.   On: 06/15/2014 17:37    Microbiology: No results found for this or any previous visit (from the past 240 hour(s)).   Labs: Basic Metabolic Panel:  Recent Labs Lab 06/15/14 2104 06/16/14 0425 06/17/14 0535 06/18/14 0843 06/19/14 0515 06/20/14 0345  NA  --  140 136* 138 135* 135*  K  --  3.7 3.7 3.5* 3.5* 3.6*  CL  --  94* 95* 97 97 95*  CO2  --  26 24 25 23 24   GLUCOSE  --  138* 102* 197* 126* 142*  BUN  --  133* 111* 68* 49* 37*  CREATININE  --  3.28* 2.32* 1.49* 1.31 1.21  CALCIUM  --  9.5 8.8 8.8 8.8 8.5  MG 3.9*  --   --   --   --    --   PHOS 4.3  --   --   --   --   --  Liver Function Tests:  Recent Labs Lab 06/15/14 1447 06/16/14 0425  AST 50* 48*  ALT 53 54*  ALKPHOS 143* 134*  BILITOT <0.2* 0.2*  PROT 8.1 7.7  ALBUMIN 2.9* 2.9*   No results for input(s): LIPASE, AMYLASE in the last 168 hours. No results for input(s): AMMONIA in the last 168 hours. CBC:  Recent Labs Lab 06/15/14 1447 06/16/14 0425 06/17/14 0535 06/18/14 0843  WBC 12.9* 12.8* 13.6* 13.9*  NEUTROABS  --   --   --  8.1*  HGB 13.0 12.1* 12.1* 12.5*  HCT 39.3 37.6* 37.0* 38.6*  MCV 93.3 94.0 94.4 92.8  PLT 294 279 255 266   Cardiac Enzymes: No results for input(s): CKTOTAL, CKMB, CKMBINDEX, TROPONINI in the last 168 hours. BNP: BNP (last 3 results) No results for input(s): PROBNP in the last 8760 hours. CBG:  Recent Labs Lab 06/17/14 0739 06/18/14 0741 06/18/14 2121 06/19/14 0741 06/20/14 0750  GLUCAP 112* 121* 147* 130* 128*       Signed:  Aman Bonet MD [facility] 06/20/2014, 11:37 AM

## 2014-06-20 NOTE — Progress Notes (Signed)
CSW confrimed with rn that patient is ready for discharge home. CSW arranged non emergency ambulance transport for 1pm as agreed with RN. Pt family aware and in agreement. Pt daughter to meet patient at home and daughter cindy at bedside. No further Clinical Social Work needs, signing off.   Byrd HesselbachKristen Jasmond River, LCSW [phone]  ED CSW 06/20/2014 12:18 PM

## 2014-07-30 ENCOUNTER — Telehealth: Payer: Self-pay | Admitting: Cardiovascular Disease

## 2014-07-30 NOTE — Telephone Encounter (Signed)
Stanton KidneyDebra called in stating that the pt passed away on 12/23. She would like for Britta MccreedyBarbara to call her when she gets a chance.   Thanks

## 2014-07-31 DEATH — deceased

## 2014-08-04 NOTE — Telephone Encounter (Signed)
Called and spoke with daughter about her fathers death.  [facility].  She is adjusting as best as she can.

## 2014-08-13 ENCOUNTER — Telehealth: Payer: Self-pay

## 2014-08-13 NOTE — Telephone Encounter (Signed)
08/13/2014 Received telephone not 07/30/2014 that patient died on 2014/06/19, I marked in all our systems that patient is deceased. [facility] pulled [facility].  cbr

## 2014-08-17 NOTE — Telephone Encounter (Signed)
error 

## 2014-11-22 NOTE — Op Note (Signed)
PATIENT NAME:  Dale Whitehead, Dale Whitehead MR#:  [id number] DATE OF BIRTH:  [birth year]  DATE OF PROCEDURE:  12/22/2011  PREOPERATIVE DIAGNOSIS:  Cataract, left eye.   POSTOPERATIVE DIAGNOSIS:  Cataract, left eye.  PROCEDURE PERFORMED:  Extracapsular cataract extraction using phacoemulsification with placement of an Alcon SN6CWS, 23.5-diopter posterior chamber lens, serial # [id number].  SURGEON:  Maylon PeppersSteven A. Sharan Mcenaney, MD  ASSISTANT:  None.  ANESTHESIA:  4% lidocaine and 0.75% Marcaine in a 50/50 mixture with 10 units per mL of Hylenex added, given as a peribulbar.  ANESTHESIOLOGIST:  Dr. Maisie Fushomas.  COMPLICATIONS:  None.  ESTIMATED BLOOD LOSS:  Less than 1 ml.  DESCRIPTION OF PROCEDURE:  The patient was brought to the operating room and given a peribulbar block.  The patient was then prepped and draped in the usual fashion.  The vertical rectus muscles were imbricated using 5-0 silk sutures.  These sutures were then clamped to the sterile drapes as bridle sutures.  A limbal peritomy was performed extending two clock hours and hemostasis was obtained with cautery.  A partial thickness scleral groove was made at the surgical limbus and dissected anteriorly in a lamellar dissection using an Alcon crescent knife.  The anterior chamber was entered superonasally with a Superblade and through the lamellar dissection with a 2.6 mm keratome.  DisCoVisc was used to replace the aqueous and a continuous tear capsulorrhexis was carried out.  Hydrodissection and hydrodelineation were carried out with balanced salt and a 27 gauge canula.  The nucleus was rotated to confirm the effectiveness of the hydrodissection.  Phacoemulsification was carried out using a divide-and-conquer technique.  Total ultrasound time was 2 minutes and 3 seconds with an average power of 14.1 percent. CDE of 32.19.  Irrigation/aspiration was used to remove the residual cortex.  DisCoVisc was used to inflate the capsule and the internal  incision was enlarged to 3 mm with the crescent knife.  The intraocular lens was folded and inserted into the capsular bag using the AcrySert delivery system.  Irrigation/aspiration was used to remove the residual DisCoVisc.  Miostat was injected into the anterior chamber through the paracentesis track to inflate the anterior chamber and induce miosis.  The wound was checked for leaks and none were found. The conjunctiva was closed with cautery and the bridle sutures were removed.  Two drops of 0.3% Vigamox were placed on the eye.   An eye shield was placed on the eye.  The patient was discharged to the recovery room in good condition. ____________________________ Maylon PeppersSteven A. Kevona Lupinacci, MD sad:rbg D: 12/22/2011 12:01:29 ET T: 12/22/2011 13:40:35 ET JOB#: [id number]  cc: Viviann SpareSteven A. Athelene Hursey, MD, <Dictator> Erline LevineSTEVEN A Lashaunta Sicard MD ELECTRONICALLY SIGNED 12/25/2011 13:08

## 2015-10-24 IMAGING — CR DG PELVIS 1-2V
1 series · 1 of 1 positions shown · non-contrast
Comparison: None.

CLINICAL DATA: Fall.  Pain in the shoulder.  Less pain in the hips.

EXAM:
PELVIS - 1-2 VIEW

[t pelvis ap]
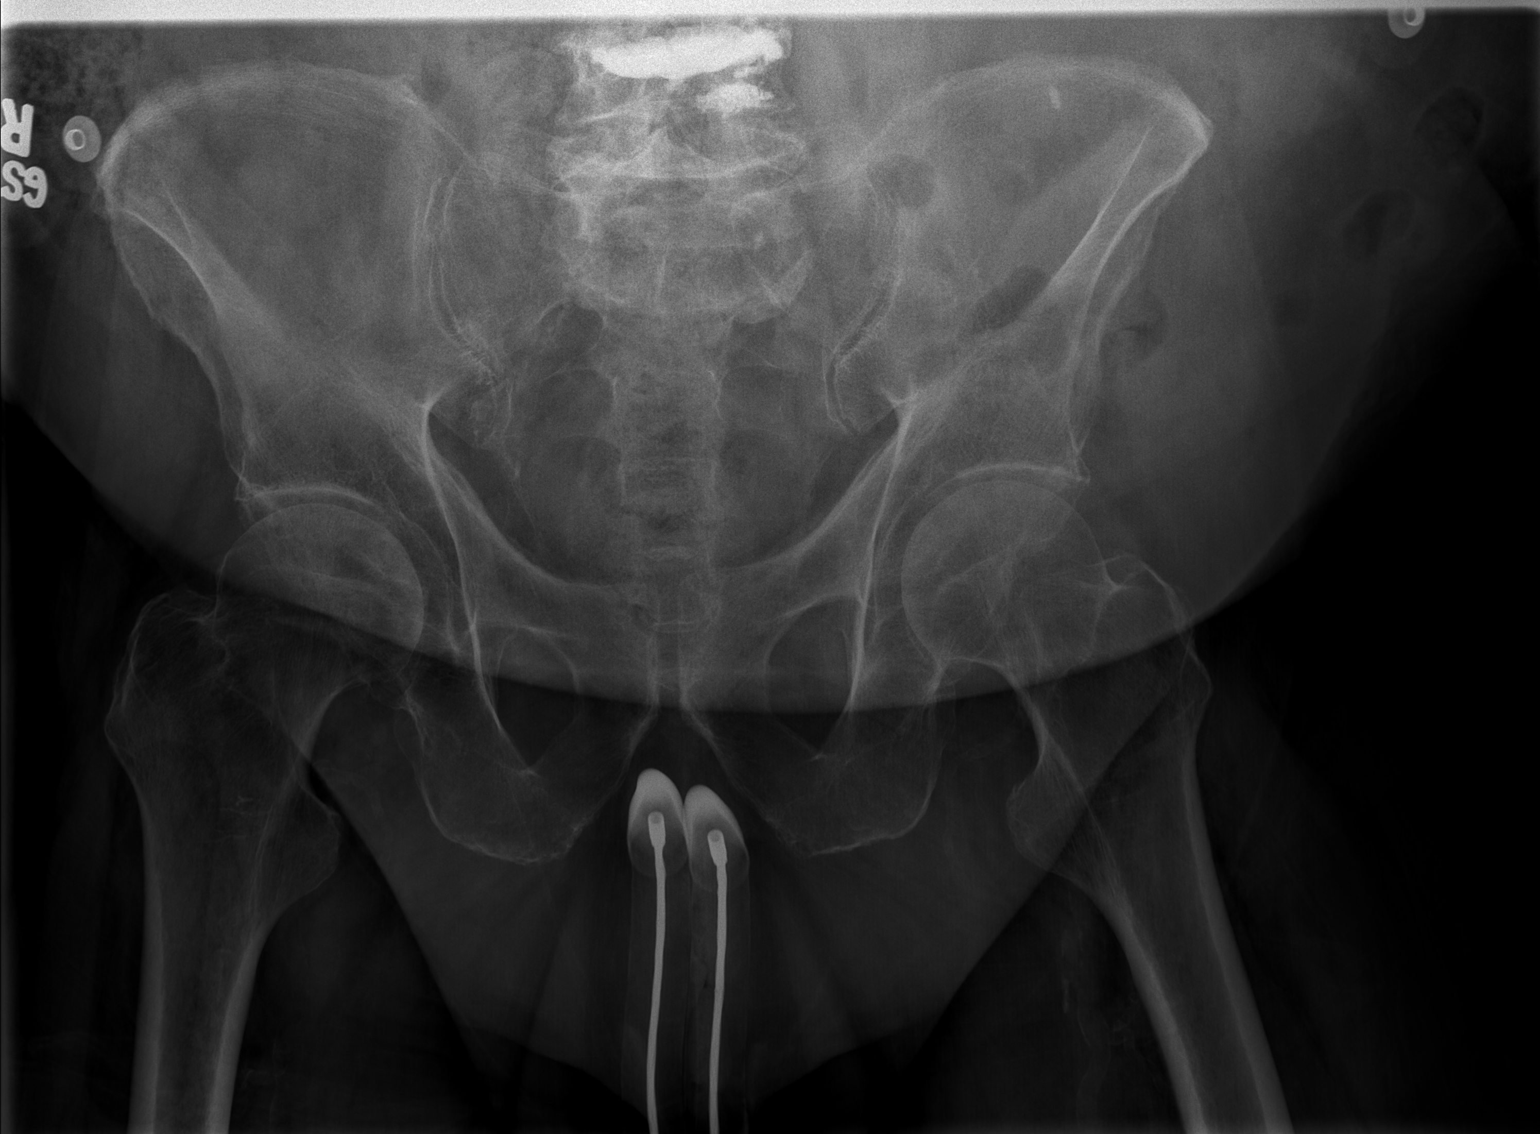

[1 of 1 positions shown; findings below may reference images not displayed]

FINDINGS: Bones appear radiolucent. No evidence for acute fracture or
dislocation. Patient has had previous kyphoplasty of the lumbar
spine. Penile prosthesis is identified.
IMPRESSION: No evidence for acute  abnormality.

## 2015-10-24 IMAGING — CR DG WRIST COMPLETE 3+V*R*
4 series · 4 of 4 positions shown · non-contrast
Comparison: None.

CLINICAL DATA: Status post fall.  Right wrist pain.

EXAM:
RIGHT WRIST - COMPLETE 3+ VIEW

[pa (1 of 2)]
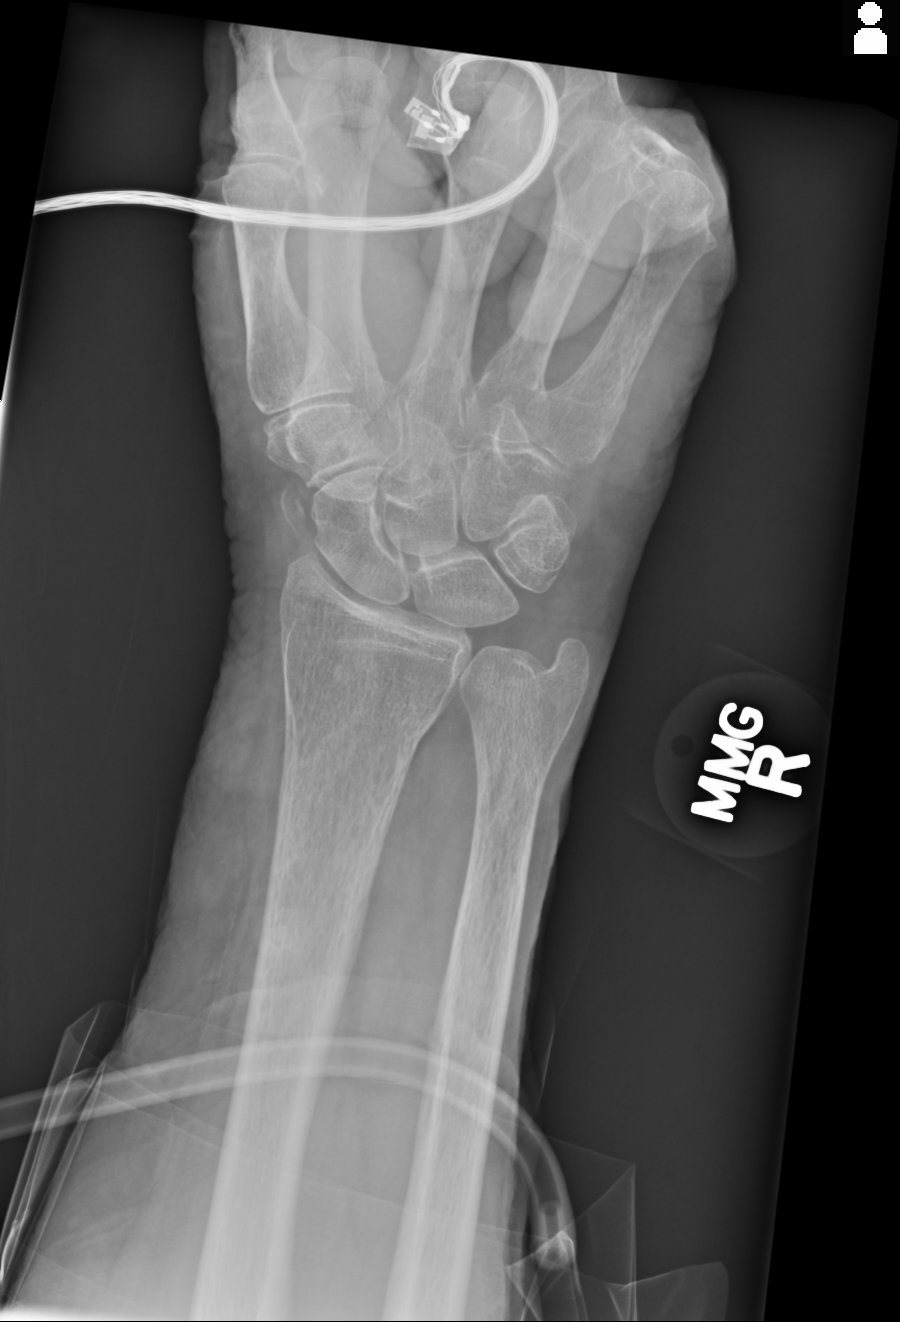

[oblique]
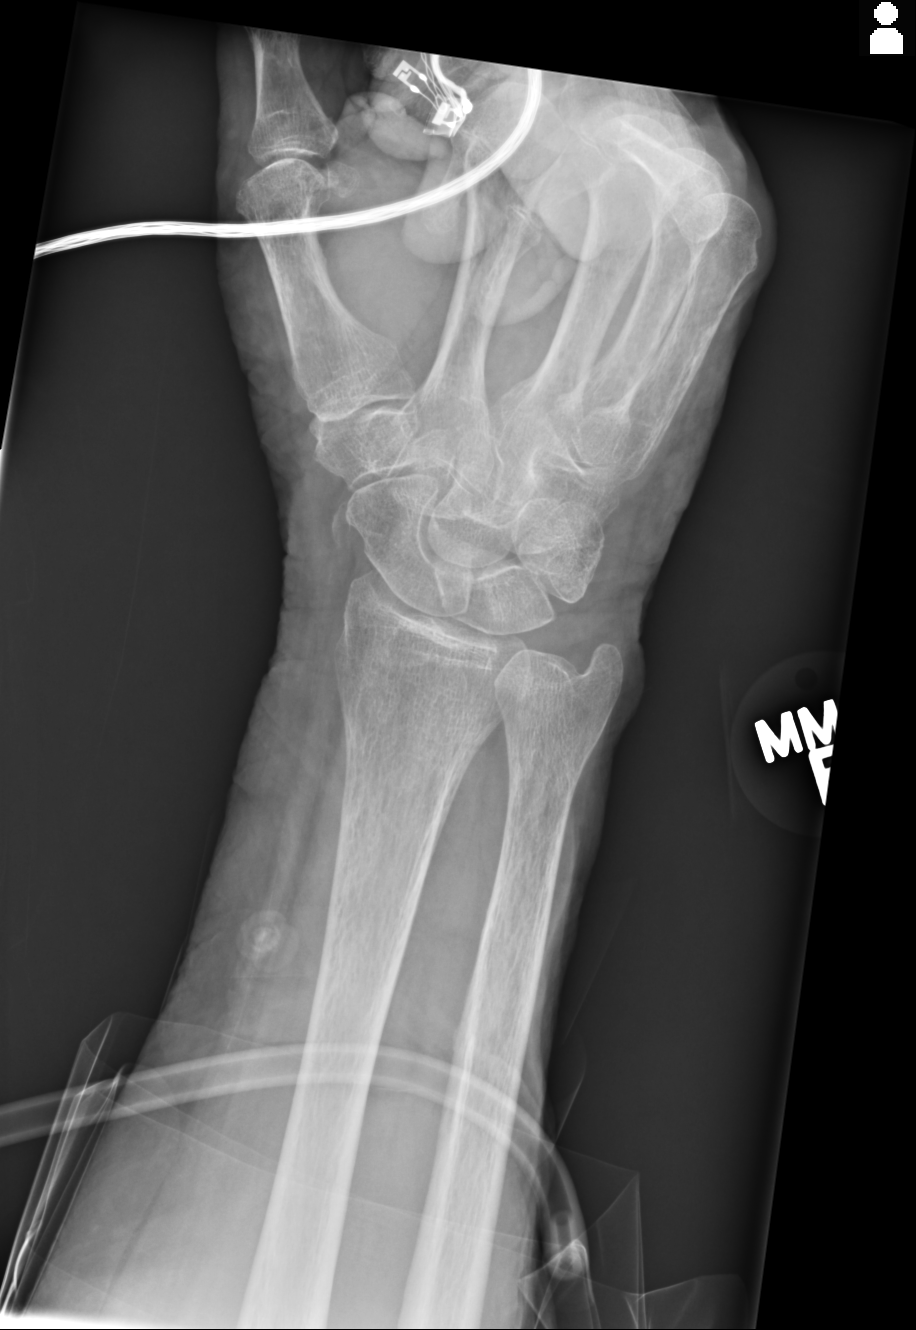

[lat]
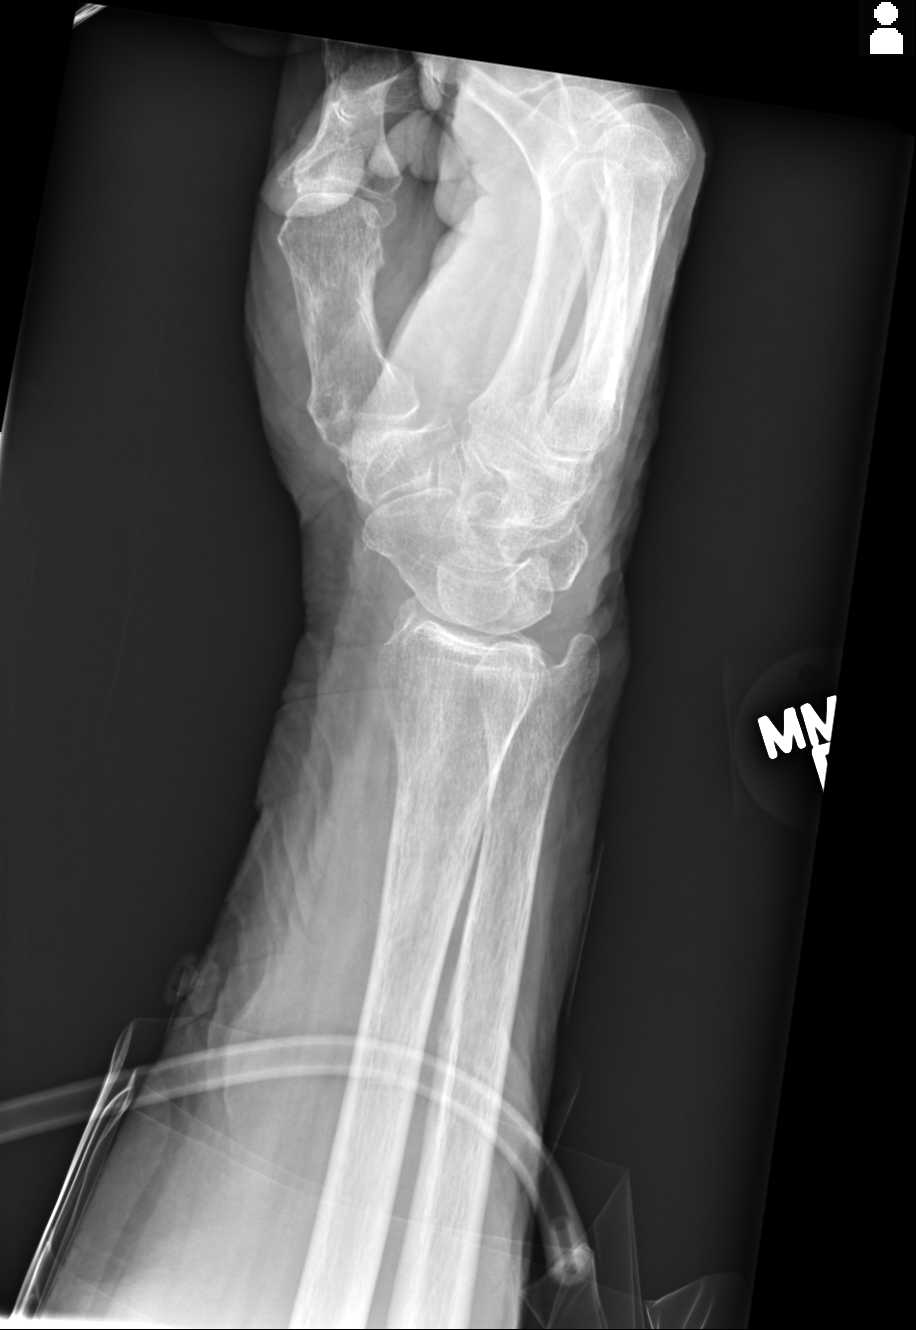

[pa (2 of 2)]
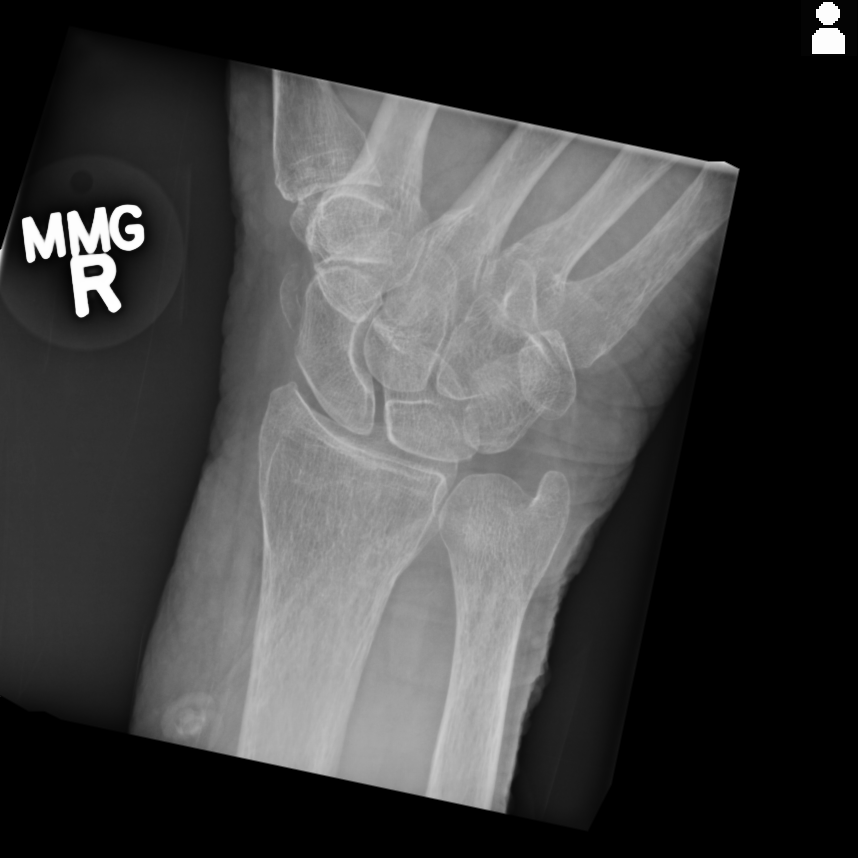

[4 of 4 positions shown; findings below may reference images not displayed]

FINDINGS: An osseous fragment is noted along the radial aspect of the
scaphoid, raising suspicion for a focal chip fracture from the
radial aspect of the scaphoid. This is somewhat unusual. The
scaphoid waist appears grossly intact.

The carpal rows are otherwise grossly unremarkable. Visualized joint
spaces are preserved. No significant soft tissue abnormalities are
characterized on radiograph.
IMPRESSION: Osseous fragment along the radial aspect of the scaphoid raises
suspicion for a focal chip fracture from the radial aspect of the
scaphoid. This is a somewhat unusual fracture. The scaphoid waist
appears grossly intact, though follow-up wrist radiograph may be
performed in 2 weeks to assess for an additional fracture line.

## 2015-10-24 IMAGING — CR DG SHOULDER 2+V*R*
2 series · 2 of 2 positions shown · non-contrast
Comparison: None.

CLINICAL DATA: Status post fall.  Right shoulder pain.

EXAM:
RIGHT SHOULDER - 2+ VIEW

[t shoulder internal right]
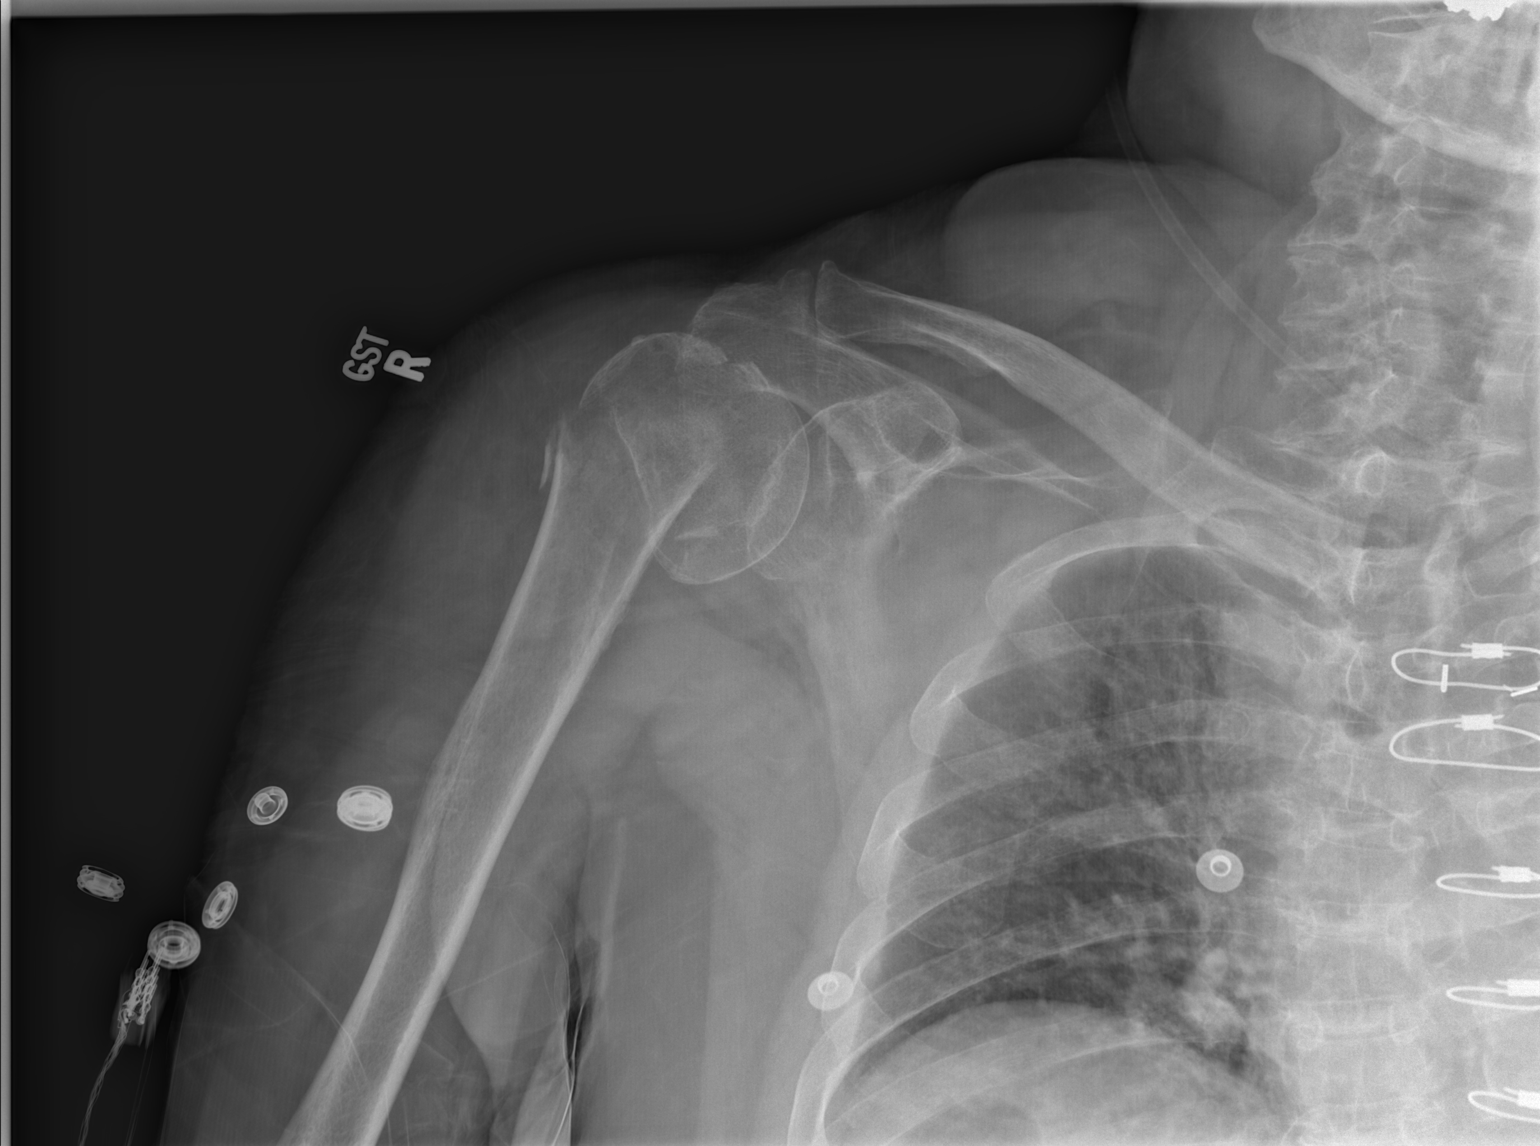

[t shoulder y-view right]
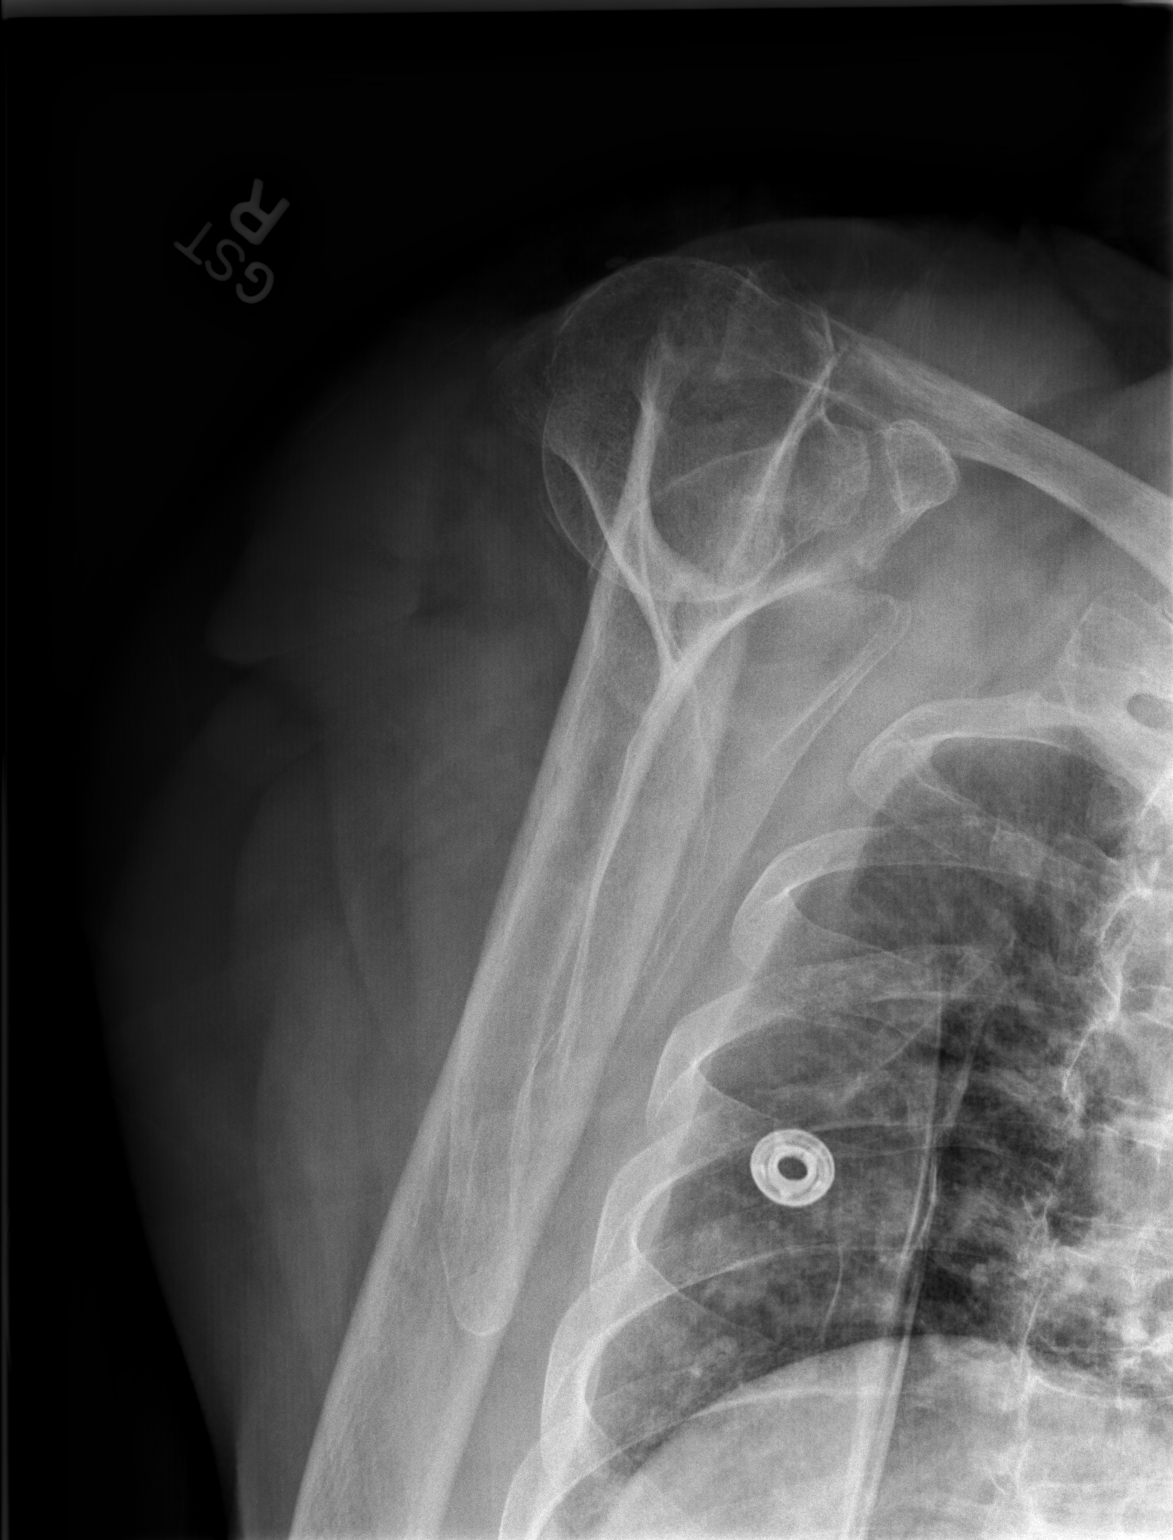

[2 of 2 positions shown; findings below may reference images not displayed]

FINDINGS: There is a comminuted fracture involving the right humeral head and
neck, with shortening at the fracture site and mild rotation of the
humeral head. A smaller lateral humeral head fragment demonstrates
minimal displacement. The humeral head remains seated at the
glenoid.

Mild degenerative changes noted at the right acromioclavicular
joint. No additional fractures are seen. The visualized right lung
appears grossly clear, though hypoexpanded.
IMPRESSION: Comminuted fracture involving the right humeral head and neck, with
shortening at the fracture site.

## 2015-10-24 IMAGING — CR DG WRIST COMPLETE 3+V*L*
4 series · 4 of 4 positions shown · non-contrast
Comparison: None.

CLINICAL DATA: Left wrist pain and swelling.

EXAM:
LEFT WRIST - COMPLETE 3+ VIEW

[x wrist pa left]
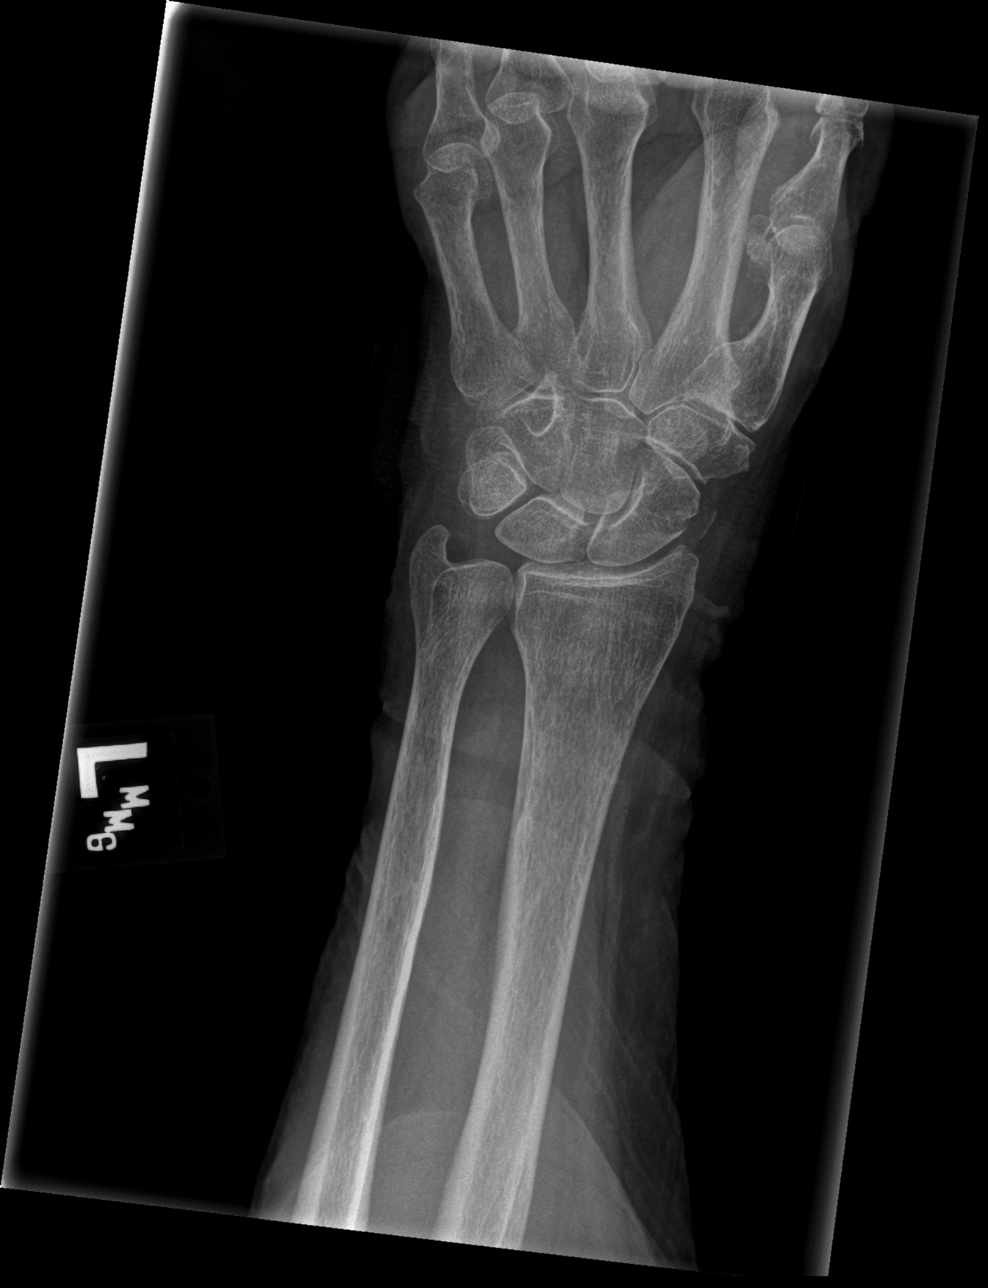

[x wrist obl left]
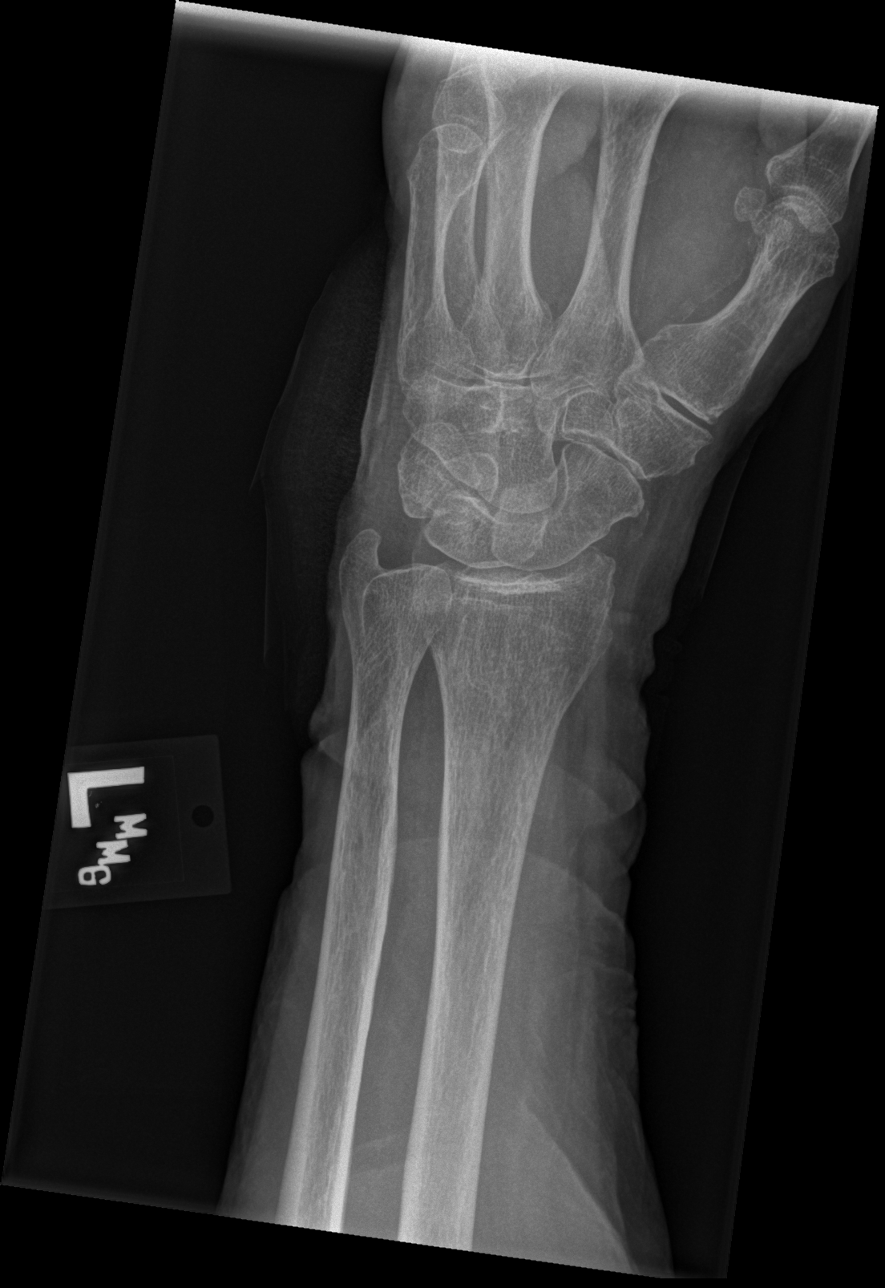

[x wrist lat left]
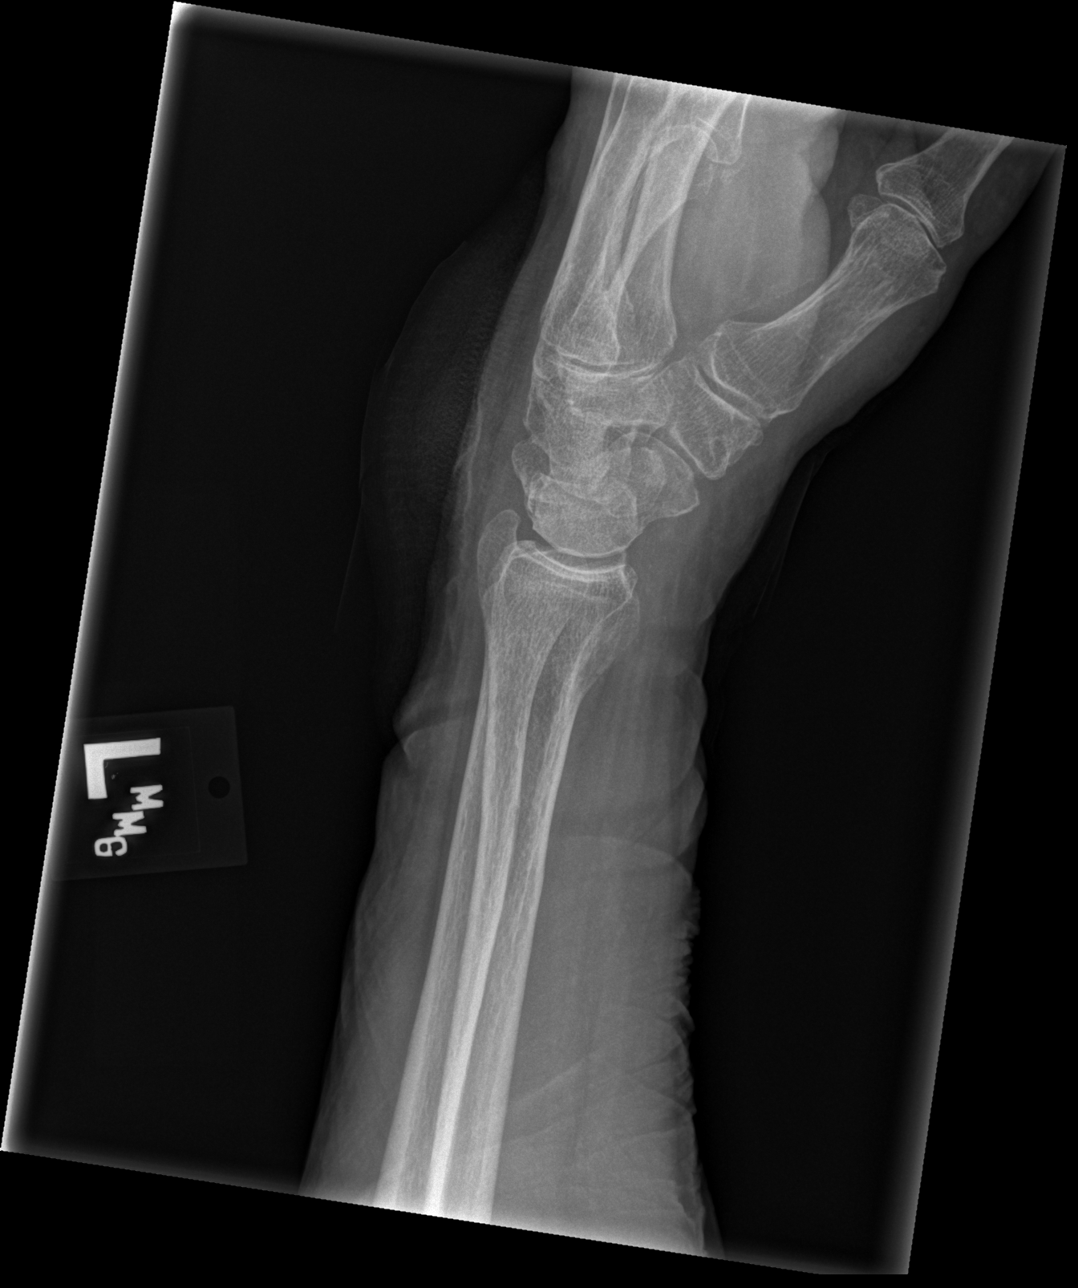

[x wrist navicular view left]
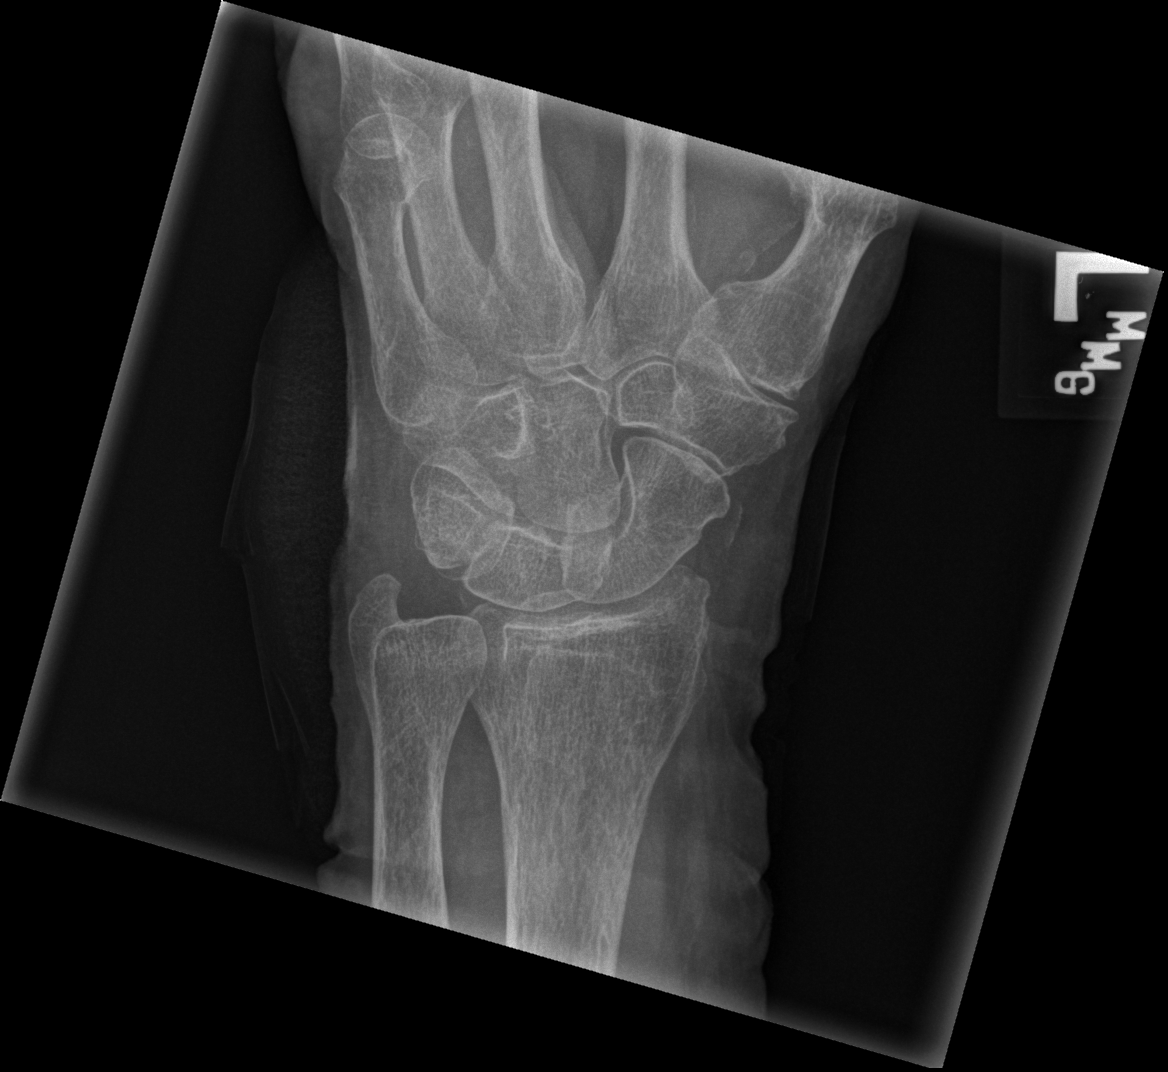

[4 of 4 positions shown; findings below may reference images not displayed]

FINDINGS: There is no evidence of fracture or dislocation. The carpal rows are
intact, and demonstrate normal alignment. The joint spaces are
preserved.

A focus of calcification radial to the scaphoid likely reflects soft
tissue calcification, or may reflect remote injury.
IMPRESSION: No evidence of fracture or dislocation.
